# Patient Record
Sex: Female | Born: 1970 | Race: White | Hispanic: No | Marital: Married | Smoking: Never smoker
Health system: Southern US, Community
[De-identification: ages and names within clinical notes are randomized; demographics above are authoritative.]

## PROBLEM LIST (undated history)

## (undated) DIAGNOSIS — B019 Varicella without complication: Secondary | ICD-10-CM

## (undated) HISTORY — DX: Varicella without complication: B01.9

---

## 1998-02-06 ENCOUNTER — Other Ambulatory Visit: Admission: RE | Admit: 1998-02-06 | Discharge: 1998-02-06 | Payer: Self-pay | Admitting: *Deleted

## 1998-05-20 ENCOUNTER — Inpatient Hospital Stay (HOSPITAL_COMMUNITY): Admission: AD | Admit: 1998-05-20 | Discharge: 1998-05-20 | Payer: Self-pay | Admitting: Obstetrics and Gynecology

## 1998-06-16 ENCOUNTER — Encounter: Admission: RE | Admit: 1998-06-16 | Discharge: 1998-09-14 | Payer: Self-pay | Admitting: Obstetrics and Gynecology

## 1998-08-22 ENCOUNTER — Inpatient Hospital Stay (HOSPITAL_COMMUNITY): Admission: AD | Admit: 1998-08-22 | Discharge: 1998-08-23 | Payer: Self-pay | Admitting: Obstetrics and Gynecology

## 1998-09-23 ENCOUNTER — Other Ambulatory Visit: Admission: RE | Admit: 1998-09-23 | Discharge: 1998-09-23 | Payer: Self-pay | Admitting: Obstetrics and Gynecology

## 1999-09-21 ENCOUNTER — Other Ambulatory Visit: Admission: RE | Admit: 1999-09-21 | Discharge: 1999-09-21 | Payer: Self-pay | Admitting: Obstetrics and Gynecology

## 1999-11-16 ENCOUNTER — Other Ambulatory Visit: Admission: RE | Admit: 1999-11-16 | Discharge: 1999-11-16 | Payer: Self-pay | Admitting: Obstetrics and Gynecology

## 1999-11-16 ENCOUNTER — Encounter (INDEPENDENT_AMBULATORY_CARE_PROVIDER_SITE_OTHER): Payer: Self-pay | Admitting: Specialist

## 2000-04-28 ENCOUNTER — Other Ambulatory Visit: Admission: RE | Admit: 2000-04-28 | Discharge: 2000-04-28 | Payer: Self-pay | Admitting: *Deleted

## 2001-02-23 ENCOUNTER — Other Ambulatory Visit: Admission: RE | Admit: 2001-02-23 | Discharge: 2001-02-23 | Payer: Self-pay | Admitting: Obstetrics and Gynecology

## 2002-03-01 ENCOUNTER — Other Ambulatory Visit: Admission: RE | Admit: 2002-03-01 | Discharge: 2002-03-01 | Payer: Self-pay | Admitting: Obstetrics and Gynecology

## 2003-03-05 ENCOUNTER — Other Ambulatory Visit: Admission: RE | Admit: 2003-03-05 | Discharge: 2003-03-05 | Payer: Self-pay | Admitting: Obstetrics and Gynecology

## 2003-05-01 ENCOUNTER — Emergency Department (HOSPITAL_COMMUNITY): Admission: AD | Admit: 2003-05-01 | Discharge: 2003-05-01 | Payer: Self-pay | Admitting: Family Medicine

## 2004-09-16 ENCOUNTER — Encounter: Admission: RE | Admit: 2004-09-16 | Discharge: 2004-09-16 | Payer: Self-pay | Admitting: Obstetrics and Gynecology

## 2004-10-29 ENCOUNTER — Inpatient Hospital Stay (HOSPITAL_COMMUNITY): Admission: AD | Admit: 2004-10-29 | Discharge: 2004-10-29 | Payer: Self-pay | Admitting: Obstetrics and Gynecology

## 2004-10-30 LAB — CONVERTED CEMR LAB: Pap Smear: NORMAL

## 2004-10-31 ENCOUNTER — Observation Stay (HOSPITAL_COMMUNITY): Admission: AD | Admit: 2004-10-31 | Discharge: 2004-10-31 | Payer: Self-pay | Admitting: Obstetrics and Gynecology

## 2004-11-11 ENCOUNTER — Inpatient Hospital Stay (HOSPITAL_COMMUNITY): Admission: AD | Admit: 2004-11-11 | Discharge: 2004-11-13 | Payer: Self-pay | Admitting: Obstetrics and Gynecology

## 2007-10-19 ENCOUNTER — Ambulatory Visit: Payer: Self-pay | Admitting: Family Medicine

## 2007-10-19 DIAGNOSIS — Z8679 Personal history of other diseases of the circulatory system: Secondary | ICD-10-CM | POA: Insufficient documentation

## 2007-10-19 DIAGNOSIS — D509 Iron deficiency anemia, unspecified: Secondary | ICD-10-CM | POA: Insufficient documentation

## 2007-10-19 DIAGNOSIS — Z8742 Personal history of other diseases of the female genital tract: Secondary | ICD-10-CM

## 2007-10-19 DIAGNOSIS — J309 Allergic rhinitis, unspecified: Secondary | ICD-10-CM | POA: Insufficient documentation

## 2007-10-19 DIAGNOSIS — R87619 Unspecified abnormal cytological findings in specimens from cervix uteri: Secondary | ICD-10-CM

## 2007-10-19 DIAGNOSIS — R5383 Other fatigue: Secondary | ICD-10-CM

## 2007-10-19 DIAGNOSIS — N920 Excessive and frequent menstruation with regular cycle: Secondary | ICD-10-CM

## 2007-10-19 DIAGNOSIS — R5381 Other malaise: Secondary | ICD-10-CM

## 2007-12-26 ENCOUNTER — Ambulatory Visit: Payer: Self-pay | Admitting: Family Medicine

## 2007-12-27 ENCOUNTER — Encounter: Payer: Self-pay | Admitting: Family Medicine

## 2007-12-28 ENCOUNTER — Other Ambulatory Visit: Admission: RE | Admit: 2007-12-28 | Discharge: 2007-12-28 | Payer: Self-pay | Admitting: Family Medicine

## 2007-12-28 ENCOUNTER — Encounter: Payer: Self-pay | Admitting: Family Medicine

## 2007-12-28 ENCOUNTER — Ambulatory Visit: Payer: Self-pay | Admitting: Family Medicine

## 2007-12-28 LAB — CONVERTED CEMR LAB
ALT: 11 units/L (ref 0–35)
AST: 12 units/L (ref 0–37)
Basophils Relative: 0 % (ref 0–1)
Calcium: 9.1 mg/dL (ref 8.4–10.5)
Chloride: 108 meq/L (ref 96–112)
Creatinine, Ser: 0.88 mg/dL (ref 0.40–1.20)
Folate: 13.6 ng/mL
Hemoglobin: 13.6 g/dL (ref 12.0–15.0)
INR: 1 (ref 0.0–1.5)
Lymphocytes Relative: 33 % (ref 12–46)
MCHC: 33.3 g/dL (ref 30.0–36.0)
Monocytes Relative: 9 % (ref 3–12)
Neutro Abs: 3 10*3/uL (ref 1.7–7.7)
Neutrophils Relative %: 56 % (ref 43–77)
RBC: 4.52 M/uL (ref 3.87–5.11)
Sodium: 138 meq/L (ref 135–145)
TSH: 1.603 microintl units/mL (ref 0.350–4.50)
Total Bilirubin: 0.6 mg/dL (ref 0.3–1.2)
Total CHOL/HDL Ratio: 3
Total Protein: 6.8 g/dL (ref 6.0–8.3)
VLDL: 17 mg/dL (ref 0–40)
Vitamin B-12: 251 pg/mL (ref 211–911)
WBC: 5.3 10*3/uL (ref 4.0–10.5)
aPTT: 37 s (ref 24–37)

## 2008-01-01 ENCOUNTER — Encounter (INDEPENDENT_AMBULATORY_CARE_PROVIDER_SITE_OTHER): Payer: Self-pay | Admitting: *Deleted

## 2013-06-21 ENCOUNTER — Other Ambulatory Visit (HOSPITAL_COMMUNITY)
Admission: RE | Admit: 2013-06-21 | Discharge: 2013-06-21 | Disposition: A | Discharge status: Home / Self Care | Payer: BC Managed Care – PPO | Source: Ambulatory Visit | Attending: Internal Medicine | Admitting: Internal Medicine

## 2013-06-21 ENCOUNTER — Ambulatory Visit (INDEPENDENT_AMBULATORY_CARE_PROVIDER_SITE_OTHER): Payer: BC Managed Care – PPO | Admitting: Internal Medicine

## 2013-06-21 ENCOUNTER — Encounter: Payer: Self-pay | Admitting: Internal Medicine

## 2013-06-21 VITALS — BP 126/80 | HR 73 | Temp 98.3°F | Ht 67.75 in | Wt 226.5 lb

## 2013-06-21 DIAGNOSIS — Z1151 Encounter for screening for human papillomavirus (HPV): Secondary | ICD-10-CM | POA: Insufficient documentation

## 2013-06-21 DIAGNOSIS — Z0000 Encounter for general adult medical examination without abnormal findings: Secondary | ICD-10-CM

## 2013-06-21 DIAGNOSIS — Z800 Family history of malignant neoplasm of digestive organs: Secondary | ICD-10-CM

## 2013-06-21 DIAGNOSIS — R1011 Right upper quadrant pain: Secondary | ICD-10-CM

## 2013-06-21 DIAGNOSIS — Z01419 Encounter for gynecological examination (general) (routine) without abnormal findings: Secondary | ICD-10-CM | POA: Insufficient documentation

## 2013-06-21 DIAGNOSIS — E669 Obesity, unspecified: Secondary | ICD-10-CM | POA: Insufficient documentation

## 2013-06-21 LAB — CBC
HCT: 43.3 % (ref 36.0–46.0)
Hemoglobin: 14.2 g/dL (ref 12.0–15.0)
MCHC: 32.8 g/dL (ref 30.0–36.0)
MCV: 92.7 fl (ref 78.0–100.0)
PLATELETS: 265 10*3/uL (ref 150.0–400.0)
RBC: 4.67 Mil/uL (ref 3.87–5.11)
RDW: 13.5 % (ref 11.5–14.6)
WBC: 8.9 10*3/uL (ref 4.5–10.5)

## 2013-06-21 LAB — COMPREHENSIVE METABOLIC PANEL
ALT: 19 U/L (ref 0–35)
AST: 14 U/L (ref 0–37)
Albumin: 4 g/dL (ref 3.5–5.2)
Alkaline Phosphatase: 78 U/L (ref 39–117)
BILIRUBIN TOTAL: 0.4 mg/dL (ref 0.3–1.2)
BUN: 14 mg/dL (ref 6–23)
CO2: 26 mEq/L (ref 19–32)
CREATININE: 0.9 mg/dL (ref 0.4–1.2)
Calcium: 9.1 mg/dL (ref 8.4–10.5)
Chloride: 104 mEq/L (ref 96–112)
GFR: 71.73 mL/min (ref >60.00)
Glucose, Bld: 90 mg/dL (ref 70–99)
Potassium: 3.9 mEq/L (ref 3.5–5.1)
Sodium: 136 mEq/L (ref 135–145)
Total Protein: 7 g/dL (ref 6.0–8.3)

## 2013-06-21 LAB — LIPID PANEL
Cholesterol: 146 mg/dL (ref 0–200)
HDL: 42.7 mg/dL (ref >39.00)
LDL CALC: 77 mg/dL (ref 0–99)
TRIGLYCERIDES: 134 mg/dL (ref 0.0–149.0)
Total CHOL/HDL Ratio: 3
VLDL: 26.8 mg/dL (ref 0.0–40.0)

## 2013-06-21 LAB — HEMOGLOBIN A1C: Hgb A1c MFr Bld: 5.8 % (ref 4.6–6.5)

## 2013-06-21 LAB — TSH: TSH: 1.07 u[IU]/mL (ref 0.35–5.50)

## 2013-06-21 NOTE — Patient Instructions (Addendum)

## 2013-06-21 NOTE — Assessment & Plan Note (Signed)
Advised low carb diet Will consider joining weight watchers

## 2013-06-21 NOTE — Progress Notes (Signed)
HPI  Pt presents to the clinic today to establish care. She has not had a PCP in > 5 years. She does have a few concerns today.  1-RUQ abdominal pain. This started about 2 years ago. The pain seems to come and go. She denies associated s/s such as nausea, vomiting, diarrhea or constipation. She is having normal BMS. She denies blood in her stool. She does have a long standing family history of colon cancer.  2- Left breast pain. She noticed this about 1 month ago. She describes the pain as sharp and shooting. She has noticed a lump in her breast during a SBE. She denies discharge from the nipple. She denies any trauma to the breast.  Flu: never Tetanus: 2009 LMP: 06/04/13 Pap Smear: more than 5 years ago Mammogram: never Eye Doctor: as needed Dentist: biannually  Past Medical History  Diagnosis Date  . Chicken pox     Current Outpatient Prescriptions  Medication Sig Dispense Refill  . Ascorbic Acid (CHEWABLE VITAMIN C PO) Take 2 each by mouth daily.      . Cholecalciferol (VITAMIN D3) 1000 UNITS CAPS Take 2 capsules by mouth daily.       No current facility-administered medications for this visit.    No Known Allergies  Family History  Problem Relation Age of Onset  . Colon cancer Mother   . Hypothyroidism Father   . Colon cancer Maternal Grandmother   . Prostate cancer Maternal Grandfather   . Hyperlipidemia Paternal Grandmother   . Heart disease Paternal Grandmother   . Stroke Paternal Grandmother   . Hypertension Paternal Grandmother     History   Social History  . Marital Status: Married    Spouse Name: N/A    Number of Children: N/A  . Years of Education: N/A   Occupational History  . Not on file.   Social History Main Topics  . Smoking status: Never Smoker   . Smokeless tobacco: Never Used  . Alcohol Use: No  . Drug Use: No  . Sexual Activity: Yes   Other Topics Concern  . Not on file   Social History Narrative  . No narrative on file     ROS:  Constitutional: Denies fever, malaise, fatigue, headache or abrupt weight changes.  HEENT: Denies eye pain, eye redness, ear pain, ringing in the ears, wax buildup, runny nose, nasal congestion, bloody nose, or sore throat. Respiratory: Denies difficulty breathing, shortness of breath, cough or sputum production.   Cardiovascular: Denies chest pain, chest tightness, palpitations or swelling in the hands or feet.  Gastrointestinal: Pt reports RUQ pain. Denies bloating, constipation, diarrhea or blood in the stool.  GU: Denies frequency, urgency, pain with urination, blood in urine, odor or discharge. Musculoskeletal: Pt reports left breast pain. Denies decrease in range of motion, difficulty with gait, muscle pain or joint pain and swelling.  Skin: Denies redness, rashes, lesions or ulcercations.  Neurological: Denies dizziness, difficulty with memory, difficulty with speech or problems with balance and coordination.   No other specific complaints in a complete review of systems (except as listed in HPI above).  PE:  BP 126/80  Pulse 73  Temp(Src) 98.3 F (36.8 C) (Oral)  Ht 5' 7.75" (1.721 m)  Wt 226 lb 8 oz (102.74 kg)  BMI 34.69 kg/m2  SpO2 99%  LMP 06/04/2013 Wt Readings from Last 3 Encounters:  06/21/13 226 lb 8 oz (102.74 kg)  12/28/07 213 lb 2.1 oz (96.676 kg)  10/19/07 214 lb 4 oz (  97.183 kg)    General: Appears her stated age, obese but well developed, well nourished in NAD. HEENT: Head: normal shape and size; Eyes: sclera white, no icterus, conjunctiva pink, PERRLA and EOMs intact; Ears: Tm's gray and intact, normal light reflex; Nose: mucosa pink and moist, septum midline; Throat/Mouth: Teeth present, mucosa pink and moist, no lesions or ulcerations noted.  Neck: Normal range of motion. Neck supple, trachea midline. No massses, lumps or thyromegaly present.  Cardiovascular: Normal rate and rhythm. S1,S2 noted.  No murmur, rubs or gallops noted. No JVD or BLE  edema. No carotid bruits noted. Pulmonary/Chest: Normal effort and positive vesicular breath sounds. No respiratory distress. No wheezes, rales or ronchi noted. Breast exam normal- some fibrocystic changes noted. Abdomen: Soft and mildly tender with palpation of the right kidney. Normal bowel sounds, no bruits noted. No distention or masses noted. Liver, spleen and kidneys non palpable. Musculoskeletal: Normal range of motion. No signs of joint swelling. No difficulty with gait. DRE normal, hemoccult negative. Neurological: Alert and oriented. Cranial nerves II-XII intact. Coordination normal. +DTRs bilaterally. Psychiatric: Mood and affect normal. Behavior is normal. Judgment and thought content normal.     BMET    Component Value Date/Time   NA 138 12/27/2007 0006   K 4.3 12/27/2007 0006   CL 108 12/27/2007 0006   CO2 23 12/27/2007 0006   GLUCOSE 101* 12/27/2007 0006   BUN 13 12/27/2007 0006   CREATININE 0.88 12/27/2007 0006   CALCIUM 9.1 12/27/2007 0006    Lipid Panel     Component Value Date/Time   CHOL 129 12/27/2007 0006   TRIG 85 12/27/2007 0006   HDL 43 12/27/2007 0006   CHOLHDL 3.0 Ratio 12/27/2007 0006   VLDL 17 12/27/2007 0006   LDLCALC 69 12/27/2007 0006    CBC    Component Value Date/Time   WBC 5.3 12/27/2007 0006   RBC 4.52 12/27/2007 0006   HGB 13.6 12/27/2007 0006   HCT 40.9 12/27/2007 0006   PLT 272 12/27/2007 0006   MCV 90.5 12/27/2007 0006   MCHC 33.3 12/27/2007 0006   RDW 13.2 12/27/2007 0006   LYMPHSABS 1.7 12/27/2007 0006   MONOABS 0.5 12/27/2007 0006   EOSABS 0.1 12/27/2007 0006   BASOSABS 0.0 12/27/2007 0006    Hgb A1C No results found for this basename: HGBA1C     Assessment and Plan:  Preventative Health Maintenance:   Pap smear obtained today Breast exam today Will refer for screening mammogram Encouraged her to work on diet and exercise (she will think about joining weight watchers) Encouraged her to visit an eye doctor at least annually Labs obtained  today  Abdominal pain:  Will check CMET to assess for gallbladder/liver disease Will have her do IFOB to check for blood at home- she is very concerned about colon cancer RTC in 1 year or sooner if needed

## 2013-06-21 NOTE — Progress Notes (Signed)
Pre visit review using our clinic review tool, if applicable. No additional management support is needed unless otherwise documented below in the visit note. 

## 2013-06-26 ENCOUNTER — Other Ambulatory Visit (INDEPENDENT_AMBULATORY_CARE_PROVIDER_SITE_OTHER): Payer: BC Managed Care – PPO

## 2013-06-26 DIAGNOSIS — R1011 Right upper quadrant pain: Secondary | ICD-10-CM

## 2013-06-26 DIAGNOSIS — Z800 Family history of malignant neoplasm of digestive organs: Secondary | ICD-10-CM

## 2013-06-26 LAB — FECAL OCCULT BLOOD, IMMUNOCHEMICAL: Fecal Occult Bld: NEGATIVE

## 2013-07-11 ENCOUNTER — Other Ambulatory Visit: Payer: Self-pay | Admitting: Internal Medicine

## 2013-07-11 ENCOUNTER — Ambulatory Visit
Admission: RE | Admit: 2013-07-11 | Discharge: 2013-07-11 | Disposition: A | Discharge status: Home / Self Care | Payer: BC Managed Care – PPO | Source: Ambulatory Visit | Attending: Internal Medicine | Admitting: Internal Medicine

## 2013-07-11 ENCOUNTER — Ambulatory Visit: Payer: BC Managed Care – PPO

## 2013-07-11 DIAGNOSIS — Z0000 Encounter for general adult medical examination without abnormal findings: Secondary | ICD-10-CM

## 2013-07-11 DIAGNOSIS — N644 Mastodynia: Secondary | ICD-10-CM

## 2013-07-24 ENCOUNTER — Ambulatory Visit
Admission: RE | Admit: 2013-07-24 | Discharge: 2013-07-24 | Disposition: A | Discharge status: Home / Self Care | Payer: BC Managed Care – PPO | Source: Ambulatory Visit | Attending: Internal Medicine | Admitting: Internal Medicine

## 2013-07-24 DIAGNOSIS — N644 Mastodynia: Secondary | ICD-10-CM

## 2014-11-21 ENCOUNTER — Encounter: Payer: Self-pay | Admitting: Internal Medicine

## 2014-11-21 ENCOUNTER — Ambulatory Visit (INDEPENDENT_AMBULATORY_CARE_PROVIDER_SITE_OTHER): Payer: BLUE CROSS/BLUE SHIELD | Admitting: Internal Medicine

## 2014-11-21 VITALS — BP 126/70 | HR 77 | Temp 98.1°F | Wt 220.0 lb

## 2014-11-21 DIAGNOSIS — W5503XA Scratched by cat, initial encounter: Secondary | ICD-10-CM

## 2014-11-21 DIAGNOSIS — S80812A Abrasion, left lower leg, initial encounter: Secondary | ICD-10-CM

## 2014-11-21 MED ORDER — AMOXICILLIN-POT CLAVULANATE 875-125 MG PO TABS: 1.0000 | ORAL_TABLET | Freq: Two times a day (BID) | ORAL | Status: DC

## 2014-11-21 NOTE — Progress Notes (Signed)
Pre visit review using our clinic review tool, if applicable. No additional management support is needed unless otherwise documented below in the visit note. 

## 2014-11-21 NOTE — Progress Notes (Signed)
Subjective:    Patient ID: Alyssa Delgado, female    DOB: 1970/10/06, 44 y.o.   MRN: [id number]  HPI  Pt presents to the clinic today with c/o a cat scratch to the back of her left calf. This occurred 3 days ago. It was her cat and it is UTD on all shots. She has noticed blisters surrounding the cat scracth. She has also noticed a little warmth and redness but has not noticed any drainage in the area. She thinks she has been running a fever but is not sure. She has not noticed any swollen glands. She has washed it with soap and water. She has rubbed baking soda and cortisone cream on it without relief.   Review of Systems      Past Medical History  Diagnosis Date  . Chicken pox     Current Outpatient Prescriptions  Medication Sig Dispense Refill  . Ascorbic Acid (CHEWABLE VITAMIN C PO) Take 2 each by mouth daily.    . Cholecalciferol (VITAMIN D3) 1000 UNITS CAPS Take 2 capsules by mouth daily.     No current facility-administered medications for this visit.    No Known Allergies  Family History  Problem Relation Age of Onset  . Colon cancer Mother   . Hypothyroidism Father   . Colon cancer Maternal Grandmother   . Prostate cancer Maternal Grandfather   . Hyperlipidemia Paternal Grandmother   . Heart disease Paternal Grandmother   . Stroke Paternal Grandmother   . Hypertension Paternal Grandmother     History   Social History  . Marital Status: Married    Spouse Name: N/A  . Number of Children: N/A  . Years of Education: N/A   Occupational History  . Not on file.   Social History Main Topics  . Smoking status: Never Smoker   . Smokeless tobacco: Never Used  . Alcohol Use: No  . Drug Use: No  . Sexual Activity: Yes   Other Topics Concern  . Not on file   Social History Narrative     Constitutional: Denies fever, malaise, fatigue, headache or abrupt weight changes.  Respiratory: Denies difficulty breathing, shortness of breath, cough or sputum  production.   Cardiovascular: Denies chest pain, chest tightness, palpitations or swelling in the hands or feet.   Skin: Pt reports cat scratch. Denies lesions or ulcercations.   No other specific complaints in a complete review of systems (except as listed in HPI above).  Objective:   Physical Exam   BP 126/70 mmHg  Pulse 77  Temp(Src) 98.1 F (36.7 C) (Oral)  Wt 220 lb (99.791 kg)  SpO2 97%  LMP 11/07/2014 Wt Readings from Last 3 Encounters:  11/21/14 220 lb (99.791 kg)  06/21/13 226 lb 8 oz (102.74 kg)  12/28/07 213 lb 2.1 oz (96.676 kg)    General: Appears her stated age, well developed, well nourished in NAD. Skin: Linear cat scratch noted to back of right calf. Blistery rash in annular formation noted around cat scratch. Area is very red, not really warm or tender to touch. Nodes: No lymphadenopathy noted.  Cardiovascular: Normal rate and rhythm. S1,S2 noted.  No murmur, rubs or gallops noted.  Pulmonary/Chest: Normal effort and positive vesicular breath sounds. No respiratory distress. No wheezes, rales or ronchi noted.  Neurological: Alert and oriented.   BMET    Component Value Date/Time   NA 136 06/21/2013 1422   K 3.9 06/21/2013 1422   CL 104 06/21/2013 1422   CO2  26 06/21/2013 1422   GLUCOSE 90 06/21/2013 1422   BUN 14 06/21/2013 1422   CREATININE 0.9 06/21/2013 1422   CALCIUM 9.1 06/21/2013 1422    Lipid Panel     Component Value Date/Time   CHOL 146 06/21/2013 1422   TRIG 134.0 06/21/2013 1422   HDL 42.70 06/21/2013 1422   CHOLHDL 3 06/21/2013 1422   VLDL 26.8 06/21/2013 1422   LDLCALC 77 06/21/2013 1422    CBC    Component Value Date/Time   WBC 8.9 06/21/2013 1422   RBC 4.67 06/21/2013 1422   HGB 14.2 06/21/2013 1422   HCT 43.3 06/21/2013 1422   PLT 265.0 06/21/2013 1422   MCV 92.7 06/21/2013 1422   MCHC 32.8 06/21/2013 1422   RDW 13.5 06/21/2013 1422   LYMPHSABS 1.7 12/27/2007 0006   MONOABS 0.5 12/27/2007 0006   EOSABS 0.1 12/27/2007  0006   BASOSABS 0.0 12/27/2007 0006    Hgb A1C Lab Results  Component Value Date   HGBA1C 5.8 06/21/2013        Assessment & Plan:   Cat Scratch:  Rash is very concerning for cat scratch fever eRx for Augmentin BID x 10 days Continue to wash with soap and water Watch for fever, enlarged lymph nodes  RTC as needed or if symptoms persist or worsen

## 2014-11-21 NOTE — Patient Instructions (Signed)
Cat Scratch Disease Cats often injure people by scratching or biting. This site of injury can become infected with a particular germ or bacteria present in the mouth of or on the cat. This germ is called Bartonella henselae. This infection is identified by the common name cat scratch disease (CSD).  SYMPTOMS  A red and sore pimple or bump, with or without pus, on the skin where the cat scratched or bit. The pimple or sore may be present for as long as three weeks after the scratch or bite occurred.  One or more enlarged lymph glands located toward the center of the body from where the injury occurred.  Less common symptoms include low-grade fever, tiredness, fatigue, headache and/or sore throat. DIAGNOSIS  The diagnosis is typically made by your caregiver who notes the history of a scratch or bite from a cat, and finds the skin sore and swollen lymph glands in the described area.  Culture of any drainage or pus from the injury site, or a needle aspiration or piece of tissue (biopsy) from a swollen lymph gland may also be done to confirm the diagnosis and assure that a different infection or disease is not causing your illness. Rare but serious complications may occur, they include:  Parinaud's syndrome - fever, swollen lymph glands and inflammation of the eye (conjunctivitis).  Infection of the brain (encephalitis).  Infection of the nerve of the eye (neuroretinitis).  Infection of the bone (osteomyelitis). TREATMENT  Usually treatment is not necessary or helpful, especially if you have a normal immune system. When infection is very severe, it may be treated with a medicine that kills the bacteria (antibiotic).  People with immune system problems (such as having AIDS or an organ transplant, or being on steroids or other immune modifying drugs) should be treated with antibiotics. HOME CARE INSTRUCTIONS   Avoid injury while playing with cats.  Wash well after playing with cats.  Do  not let your cat lick sores on your body.  Do not let your cat roam around outside of your house.  Keep the area of the cat scratch clean. Wash it with soap and water or apply an antiseptic solution such as povidone iodine.  You should get a tetanus shot if you have not had one in the past 5 or 10 years. If you receive one, your arm may get swollen and red and warm to the touch at the shot site. This is a common response to the medication in the shot. If you did not receive a tetanus shot here because you did not recall when your last one was given, make sure to check with your caregiver's office and determine if one is needed. Generally, for a "dirty" wound, you should receive a tetanus booster if you have not had one in the last five years. If you have a "clean" wound, you should receive a tetanus booster if you have not had one in the last ten years. SEEK IMMEDIATE MEDICAL CARE IF:   You have worsening signs of infection, such as more redness, increased pain, red streaking or pus coming from the wound, or warmth or swelling around the area of the scratch.  You develop worsening swollen lymph glands.  You develop abdominal pain, have problems with your vision or develop a skin rash.  You have a fever.  You become more tired or dizzy, or have a worsening headache.  You develop inflammation of your eye or have increasing vision problems.  You have pain in   one of your bones.  You develop a stiff neck.  You pass out. MAKE SURE YOU:   Understand these instructions.  Will watch your condition.  Will get help right away if you are not doing well or get worse. Document Released: 04/15/2000 Document Revised: 07/11/2011 Document Reviewed: 05/28/2008 Exit[employer] Patient Information 2015 [employer]. This information is not intended to replace advice given to you by your health care provider. Make sure you discuss any questions you have with your health care provider.  

## 2017-01-31 ENCOUNTER — Ambulatory Visit (INDEPENDENT_AMBULATORY_CARE_PROVIDER_SITE_OTHER): Payer: BLUE CROSS/BLUE SHIELD | Admitting: Internal Medicine

## 2017-01-31 ENCOUNTER — Encounter: Payer: Self-pay | Admitting: Internal Medicine

## 2017-01-31 VITALS — BP 124/84 | HR 72 | Temp 98.1°F | Wt 222.0 lb

## 2017-01-31 DIAGNOSIS — L659 Nonscarring hair loss, unspecified: Secondary | ICD-10-CM

## 2017-01-31 DIAGNOSIS — N926 Irregular menstruation, unspecified: Secondary | ICD-10-CM

## 2017-01-31 DIAGNOSIS — R350 Frequency of micturition: Secondary | ICD-10-CM | POA: Diagnosis not present

## 2017-01-31 DIAGNOSIS — R109 Unspecified abdominal pain: Secondary | ICD-10-CM | POA: Diagnosis not present

## 2017-01-31 LAB — POCT URINE PREGNANCY: Preg Test, Ur: NEGATIVE

## 2017-01-31 LAB — POCT URINALYSIS DIPSTICK
BILIRUBIN UA: NEGATIVE
Glucose, UA: NEGATIVE
Ketones, UA: NEGATIVE
NITRITE UA: NEGATIVE
Protein, UA: NEGATIVE
Spec Grav, UA: 1.025 (ref 1.010–1.025)
UROBILINOGEN UA: 0.2 U/dL
pH, UA: 6 (ref 5.0–8.0)

## 2017-02-01 ENCOUNTER — Encounter: Payer: Self-pay | Admitting: Internal Medicine

## 2017-02-01 LAB — COMPREHENSIVE METABOLIC PANEL
ALT: 17 U/L (ref 0–35)
AST: 13 U/L (ref 0–37)
Albumin: 4.1 g/dL (ref 3.5–5.2)
Alkaline Phosphatase: 82 U/L (ref 39–117)
BUN: 8 mg/dL (ref 6–23)
CO2: 28 mEq/L (ref 19–32)
CREATININE: 0.77 mg/dL (ref 0.40–1.20)
Calcium: 9.2 mg/dL (ref 8.4–10.5)
Chloride: 106 mEq/L (ref 96–112)
GFR: 86.56 mL/min (ref >60.00)
Glucose, Bld: 89 mg/dL (ref 70–99)
Potassium: 4.7 mEq/L (ref 3.5–5.1)
Sodium: 141 mEq/L (ref 135–145)
Total Bilirubin: 0.4 mg/dL (ref 0.2–1.2)
Total Protein: 6.6 g/dL (ref 6.0–8.3)

## 2017-02-01 LAB — CBC
HCT: 39.1 % (ref 36.0–46.0)
HEMOGLOBIN: 12.8 g/dL (ref 12.0–15.0)
MCHC: 32.8 g/dL (ref 30.0–36.0)
MCV: 92.3 fl (ref 78.0–100.0)
Platelets: 280 10*3/uL (ref 150.0–400.0)
RBC: 4.24 Mil/uL (ref 3.87–5.11)
RDW: 13.9 % (ref 11.5–15.5)
WBC: 6.8 10*3/uL (ref 4.0–10.5)

## 2017-02-01 LAB — FOLLICLE STIMULATING HORMONE: FSH: 5.4 m[IU]/mL

## 2017-02-01 LAB — URINE CULTURE
MICRO NUMBER:: 81092305
Result:: NO GROWTH
SPECIMEN QUALITY:: ADEQUATE

## 2017-02-01 LAB — LUTEINIZING HORMONE: LH: 5.88 m[IU]/mL

## 2017-02-01 LAB — VITAMIN B12: VITAMIN B 12: 131 pg/mL — AB (ref 211–911)

## 2017-02-01 LAB — TSH: TSH: 1.84 u[IU]/mL (ref 0.35–4.50)

## 2017-02-01 LAB — VITAMIN D 25 HYDROXY (VIT D DEFICIENCY, FRACTURES): VITD: 12.92 ng/mL — ABNORMAL LOW (ref 30.00–100.00)

## 2017-02-01 NOTE — Patient Instructions (Signed)
Alopecia Areata, Adult  Alopecia areata is a condition that causes you to lose hair. You may lose hair on your scalp in patches. In some cases, you may lose all the hair on your scalp (alopecia totalis) or all the hair from your face and body (alopecia universalis).  Alopecia areata is an autoimmune disease. This means that your body's defense system (immune system) mistakes normal parts of the body for germs or other things that can make you sick. When you have alopecia areata, the immune system attacks the hair follicles.  Alopecia areata usually develops in childhood, but it can develop at any age. For some people, their hair grows back on its own and hair loss does not happen again. For others, their hair may fall out and grow back in cycles. The hair loss may last many years. Having this condition can be emotionally difficult, but it is not dangerous.  What are the causes?  The cause of this condition is not known.  What increases the risk?  This condition is more likely to develop in people who have:   A family history of alopecia.   A family history of another autoimmune disease, including type 1 diabetes and rheumatoid arthritis.   Asthma and allergies.   Down syndrome.    What are the signs or symptoms?  Round spots of patchy hair loss on the scalp is the main symptom of this condition. The spots may be mildly itchy. Other symptoms include:   Short dark hairs in the bald patches that are wider at the top (exclamation point hairs).   Dents, white spots, or lines in the fingernails or toenails.   Balding and body hair loss. This is rare.    How is this diagnosed?  This condition is diagnosed based on your symptoms and family history. Your health care provider will also check your scalp skin, teeth, and nails. Your health care provider may refer you to a specialist in hair and skin disorders (dermatologist). You may also have tests, including:   A hair pull test.   Blood tests or other screening tests  to check for autoimmune diseases, such as thyroid disease or diabetes.   Skin biopsy to confirm the diagnosis.   A procedure to examine the skin with a lighted magnifying instrument (dermoscopy).    How is this treated?  There is no cure for alopecia areata. Treatment is aimed at promoting the regrowth of hair and preventing the immune system from overreacting. No single treatment is right for all people with alopecia areata. It depends on the type of hair loss you have and how severe it is. Work with your health care provider to find the best treatment for you. Treatment may include:   Having regular checkups to make sure the condition is not getting worse (watchful waiting).   Steroid creams or pills for 6-8 weeks to stop the immune reaction and help hair to regrow more quickly.   Other topical medicines to alter the immune system response and support the hair growth cycle.   Steroid injections.   Therapy and counseling with a support group or therapist if you are having trouble coping with hair loss.    Follow these instructions at home:   Learn as much as you can about your condition.   Apply topical creams only as told by your health care provider.   Take over-the-counter and prescription medicines only as told by your health care provider.   Consider getting a wig or   products to make hair look fuller or to cover bald spots, if you feel uncomfortable with your appearance.   Get therapy or counseling if you are having a hard time coping with hair loss. Ask your health care provider to recommend a counselor or support group.   Keep all follow-up visits as told by your health care provider. This is important.  Contact a health care provider if:   Your hair loss gets worse, even with treatment.   You have new symptoms.   You are struggling emotionally.  Summary   Alopecia areata is an autoimmune condition that makes your body's defense system (immune system) attack the hair follicles. This causes  you to lose hair.   Treatments may include regular checkups to make sure that the condition is not getting worse (watchful waiting), medicines, and steroid injections.  This information is not intended to replace advice given to you by your health care provider. Make sure you discuss any questions you have with your health care provider.  Document Released: 11/21/2003 Document Revised: 05/06/2016 Document Reviewed: 05/06/2016  Elsevier Interactive Patient Education  2018 [employer]

## 2017-02-01 NOTE — Progress Notes (Signed)
Subjective:    Patient ID: Alyssa Delgado, female    DOB: [birth year], 46 y.o.   MRN: [id number]  HPI  Pt presents to the clinic today with c/o irregular periods. This has been going on for 3 months. She reports this last menses, started 11 days ago and she is still bleeding. She reports associated lower abdominal pressure and urinary frequency, but denies urgency or dysuria. She is sexually active but denies vaginal discharge or odor. She does not think she is pregnant. She has not taken anything OTC for her symptoms. Her last pap was in 2015.  She has also noticed some hair loss. She reports when she gets out of the shower, her hairs is coming out in clumps. She denies having any bald spots.  Review of Systems      Past Medical History:  Diagnosis Date  . Chicken pox     Current Outpatient Prescriptions  Medication Sig Dispense Refill  . Cholecalciferol (VITAMIN D3) 1000 UNITS CAPS Take 2 capsules by mouth daily.     No current facility-administered medications for this visit.     No Known Allergies  Family History  Problem Relation Age of Onset  . Colon cancer Mother   . Hypothyroidism Father   . Colon cancer Maternal Grandmother   . Prostate cancer Maternal Grandfather   . Hyperlipidemia Paternal Grandmother   . Heart disease Paternal Grandmother   . Stroke Paternal Grandmother   . Hypertension Paternal Grandmother     Social History   Social History  . Marital status: Married    Spouse name: N/A  . Number of children: N/A  . Years of education: N/A   Occupational History  . Not on file.   Social History Main Topics  . Smoking status: Never Smoker  . Smokeless tobacco: Never Used  . Alcohol use No  . Drug use: No  . Sexual activity: Yes   Other Topics Concern  . Not on file   Social History Narrative  . No narrative on file     Constitutional: Denies fever, malaise, fatigue, headache or abrupt weight changes.  Gastrointestinal: Pt reports  abdominal pressure. Denies abdominal pain, bloating, constipation, diarrhea or blood in the stool.  GU: Pt reports urinary frequency, and irregular periods. Denies urgency, pain with urination, burning sensation, blood in urine, odor or discharge.  No other specific complaints in a complete review of systems (except as listed in HPI above).  Objective:   Physical Exam   BP 124/84   Pulse 72   Temp 98.1 F (36.7 C) (Oral)   Wt 222 lb (100.7 kg)   LMP 01/20/2017   SpO2 98%   BMI 34.00 kg/m  Wt Readings from Last 3 Encounters:  01/31/17 222 lb (100.7 kg)  11/21/14 220 lb (99.8 kg)  06/21/13 226 lb 8 oz (102.7 kg)    General: Appears her stated age, in NAD. Abdomen: Soft and mildly tender in the left and right lower pelvic region. Normal bowel sounds. No distention or masses noted. No CVA tenderness. Pelvic: deferred due to bleeding.  BMET    Component Value Date/Time   NA 136 06/21/2013 1422   K 3.9 06/21/2013 1422   CL 104 06/21/2013 1422   CO2 26 06/21/2013 1422   GLUCOSE 90 06/21/2013 1422   BUN 14 06/21/2013 1422   CREATININE 0.9 06/21/2013 1422   CALCIUM 9.1 06/21/2013 1422    Lipid Panel     Component Value Date/Time   CHOL  146 06/21/2013 1422   TRIG 134.0 06/21/2013 1422   HDL 42.70 06/21/2013 1422   CHOLHDL 3 06/21/2013 1422   VLDL 26.8 06/21/2013 1422   LDLCALC 77 06/21/2013 1422    CBC    Component Value Date/Time   WBC 8.9 06/21/2013 1422   RBC 4.67 06/21/2013 1422   HGB 14.2 06/21/2013 1422   HCT 43.3 06/21/2013 1422   PLT 265.0 06/21/2013 1422   MCV 92.7 06/21/2013 1422   MCHC 32.8 06/21/2013 1422   RDW 13.5 06/21/2013 1422   LYMPHSABS 1.7 12/27/2007 0006   MONOABS 0.5 12/27/2007 0006   EOSABS 0.1 12/27/2007 0006   BASOSABS 0.0 12/27/2007 0006    Hgb A1C Lab Results  Component Value Date   HGBA1C 5.8 06/21/2013           Assessment & Plan:   Lower Abdominal Pressure, Urinary Frequency, Metrroghia:  Urinalysis: 2+ leuks, 3+  blood Will send urine culture Urine Hcg: negative Will check CBC, CMET, FSH, LH, TSH May need pelvic/transvaginal ultrasound She will schedule a pap when the bleeding has stopped May need Provera to help stop the bleeding but will await labs  Hair Loss:  Will check CBC, TSH, B12 and Vit D today  Will follow up after labs, return precautions discussed Nicki Reaper, NP

## 2017-02-02 ENCOUNTER — Other Ambulatory Visit: Payer: Self-pay | Admitting: Internal Medicine

## 2017-02-02 DIAGNOSIS — N926 Irregular menstruation, unspecified: Secondary | ICD-10-CM

## 2017-02-02 MED ORDER — VITAMIN D (ERGOCALCIFEROL) 1.25 MG (50000 UNIT) PO CAPS: 50000.0000 [IU] | ORAL_CAPSULE | ORAL | 0 refills | Status: DC

## 2017-02-03 ENCOUNTER — Encounter: Payer: Self-pay | Admitting: Internal Medicine

## 2017-02-03 ENCOUNTER — Ambulatory Visit (INDEPENDENT_AMBULATORY_CARE_PROVIDER_SITE_OTHER): Payer: BLUE CROSS/BLUE SHIELD

## 2017-02-03 DIAGNOSIS — E538 Deficiency of other specified B group vitamins: Secondary | ICD-10-CM | POA: Diagnosis not present

## 2017-02-03 MED ORDER — CYANOCOBALAMIN 1000 MCG/ML IJ SOLN
1000.0000 ug | Freq: Once | INTRAMUSCULAR | Status: AC
  Administered 2017-02-03: 1000 ug via INTRAMUSCULAR

## 2017-02-07 ENCOUNTER — Ambulatory Visit: Payer: BLUE CROSS/BLUE SHIELD

## 2017-02-10 ENCOUNTER — Ambulatory Visit
Admission: RE | Admit: 2017-02-10 | Discharge: 2017-02-10 | Disposition: A | Discharge status: Home / Self Care | Payer: BLUE CROSS/BLUE SHIELD | Source: Ambulatory Visit | Attending: Internal Medicine | Admitting: Internal Medicine

## 2017-02-10 DIAGNOSIS — N926 Irregular menstruation, unspecified: Secondary | ICD-10-CM | POA: Insufficient documentation

## 2017-02-13 ENCOUNTER — Encounter: Payer: Self-pay | Admitting: Internal Medicine

## 2017-02-28 ENCOUNTER — Encounter: Payer: Self-pay | Admitting: Internal Medicine

## 2017-03-03 ENCOUNTER — Ambulatory Visit (INDEPENDENT_AMBULATORY_CARE_PROVIDER_SITE_OTHER): Payer: BLUE CROSS/BLUE SHIELD

## 2017-03-03 DIAGNOSIS — E538 Deficiency of other specified B group vitamins: Secondary | ICD-10-CM | POA: Diagnosis not present

## 2017-03-03 MED ORDER — CYANOCOBALAMIN 1000 MCG/ML IJ SOLN
1000.0000 ug | Freq: Once | INTRAMUSCULAR | Status: AC
  Administered 2017-03-03: 1000 ug via INTRAMUSCULAR

## 2017-03-07 NOTE — Progress Notes (Signed)
I reviewed need for this B12 injection and approved

## 2017-04-07 ENCOUNTER — Ambulatory Visit (INDEPENDENT_AMBULATORY_CARE_PROVIDER_SITE_OTHER): Payer: BLUE CROSS/BLUE SHIELD

## 2017-04-07 ENCOUNTER — Encounter: Payer: Self-pay | Admitting: Internal Medicine

## 2017-04-07 DIAGNOSIS — E538 Deficiency of other specified B group vitamins: Secondary | ICD-10-CM

## 2017-04-07 MED ORDER — CYANOCOBALAMIN 1000 MCG/ML IJ SOLN
1000.0000 ug | Freq: Once | INTRAMUSCULAR | Status: AC
  Administered 2017-04-07: 1000 ug via INTRAMUSCULAR

## 2017-05-12 ENCOUNTER — Ambulatory Visit (INDEPENDENT_AMBULATORY_CARE_PROVIDER_SITE_OTHER): Payer: Managed Care, Other (non HMO)

## 2017-05-12 ENCOUNTER — Encounter: Payer: Self-pay | Admitting: Internal Medicine

## 2017-05-12 DIAGNOSIS — E538 Deficiency of other specified B group vitamins: Secondary | ICD-10-CM | POA: Diagnosis not present

## 2017-05-12 MED ORDER — CYANOCOBALAMIN 1000 MCG/ML IJ SOLN
1000.0000 ug | Freq: Once | INTRAMUSCULAR | Status: AC
  Administered 2017-05-12: 1000 ug via INTRAMUSCULAR

## 2018-03-23 ENCOUNTER — Emergency Department (HOSPITAL_COMMUNITY): Payer: Managed Care, Other (non HMO)

## 2018-03-23 ENCOUNTER — Encounter (HOSPITAL_COMMUNITY): Payer: Self-pay

## 2018-03-23 ENCOUNTER — Telehealth: Payer: Self-pay

## 2018-03-23 ENCOUNTER — Emergency Department (HOSPITAL_COMMUNITY)
Admission: EM | Admit: 2018-03-23 | Discharge: 2018-03-23 | Disposition: A | Discharge status: Home / Self Care | Payer: Managed Care, Other (non HMO) | Attending: Emergency Medicine | Admitting: Emergency Medicine

## 2018-03-23 DIAGNOSIS — Z79899 Other long term (current) drug therapy: Secondary | ICD-10-CM | POA: Diagnosis not present

## 2018-03-23 DIAGNOSIS — R112 Nausea with vomiting, unspecified: Secondary | ICD-10-CM

## 2018-03-23 DIAGNOSIS — R109 Unspecified abdominal pain: Secondary | ICD-10-CM | POA: Insufficient documentation

## 2018-03-23 LAB — COMPREHENSIVE METABOLIC PANEL
ALK PHOS: 76 U/L (ref 38–126)
ALT: 14 U/L (ref 0–44)
AST: 14 U/L — ABNORMAL LOW (ref 15–41)
Albumin: 3.8 g/dL (ref 3.5–5.0)
Anion gap: 9 (ref 5–15)
BUN: 9 mg/dL (ref 6–20)
CALCIUM: 8.8 mg/dL — AB (ref 8.9–10.3)
CO2: 21 mmol/L — ABNORMAL LOW (ref 22–32)
Chloride: 112 mmol/L — ABNORMAL HIGH (ref 98–111)
Creatinine, Ser: 0.85 mg/dL (ref 0.44–1.00)
GFR calc Af Amer: >60 mL/min (ref >60)
Glucose, Bld: 93 mg/dL (ref 70–99)
Potassium: 3.5 mmol/L (ref 3.5–5.1)
Sodium: 142 mmol/L (ref 135–145)
Total Bilirubin: 0.7 mg/dL (ref 0.3–1.2)
Total Protein: 6.6 g/dL (ref 6.5–8.1)

## 2018-03-23 LAB — URINALYSIS, ROUTINE W REFLEX MICROSCOPIC

## 2018-03-23 LAB — CBC WITH DIFFERENTIAL/PLATELET
Abs Immature Granulocytes: 0.02 10*3/uL (ref 0.00–0.07)
BASOS ABS: 0 10*3/uL (ref 0.0–0.1)
Basophils Relative: 0 %
EOS ABS: 0 10*3/uL (ref 0.0–0.5)
Eosinophils Relative: 1 %
HEMATOCRIT: 40.8 % (ref 36.0–46.0)
HEMOGLOBIN: 12.7 g/dL (ref 12.0–15.0)
Immature Granulocytes: 0 %
Lymphocytes Relative: 21 %
Lymphs Abs: 1.8 10*3/uL (ref 0.7–4.0)
MCH: 28 pg (ref 26.0–34.0)
MCHC: 31.1 g/dL (ref 30.0–36.0)
MCV: 89.9 fL (ref 80.0–100.0)
MONO ABS: 0.7 10*3/uL (ref 0.1–1.0)
MONOS PCT: 8 %
Neutro Abs: 5.8 10*3/uL (ref 1.7–7.7)
Neutrophils Relative %: 70 %
Platelets: 323 10*3/uL (ref 150–400)
RBC: 4.54 MIL/uL (ref 3.87–5.11)
RDW: 14.6 % (ref 11.5–15.5)
WBC: 8.3 10*3/uL (ref 4.0–10.5)
nRBC: 0 % (ref 0.0–0.2)

## 2018-03-23 LAB — URINALYSIS, MICROSCOPIC (REFLEX): WBC, UA: >50 WBC/hpf (ref 0–5)

## 2018-03-23 LAB — I-STAT BETA HCG BLOOD, ED (MC, WL, AP ONLY): I-stat hCG, quantitative: <5 m[IU]/mL (ref <5)

## 2018-03-23 LAB — LIPASE, BLOOD: LIPASE: 23 U/L (ref 11–51)

## 2018-03-23 MED ORDER — MORPHINE SULFATE (PF) 4 MG/ML IV SOLN
4.0000 mg | Freq: Once | INTRAVENOUS | Status: AC
Start: 2018-03-23 — End: 2018-03-23
  Administered 2018-03-23: 4 mg via INTRAMUSCULAR

## 2018-03-23 MED ORDER — IOHEXOL 300 MG/ML  SOLN
100.0000 mL | Freq: Once | INTRAMUSCULAR | Status: AC | PRN
  Administered 2018-03-23: 100 mL via INTRAVENOUS

## 2018-03-23 MED ORDER — HYDROCODONE-ACETAMINOPHEN 5-325 MG PO TABS: 1.0000 | ORAL_TABLET | Freq: Four times a day (QID) | ORAL | 0 refills | Status: DC | PRN

## 2018-03-23 MED ORDER — MORPHINE SULFATE (PF) 4 MG/ML IV SOLN
4.0000 mg | Freq: Once | INTRAVENOUS | Status: DC
Start: 2018-03-23 — End: 2018-03-23
  Filled 2018-03-23: qty 1

## 2018-03-23 MED ORDER — KETOROLAC TROMETHAMINE 15 MG/ML IJ SOLN
15.0000 mg | Freq: Once | INTRAMUSCULAR | Status: AC
  Administered 2018-03-23: 15 mg via INTRAVENOUS
  Filled 2018-03-23: qty 1

## 2018-03-23 MED ORDER — ONDANSETRON HCL 4 MG PO TABS: 4.0000 mg | ORAL_TABLET | Freq: Four times a day (QID) | ORAL | 0 refills | Status: DC

## 2018-03-23 MED ORDER — SUCRALFATE 1 GM/10ML PO SUSP
1.0000 g | Freq: Three times a day (TID) | ORAL | Status: DC
  Filled 2018-03-23 (×2): qty 10

## 2018-03-23 MED ORDER — ONDANSETRON HCL 4 MG/2ML IJ SOLN
4.0000 mg | Freq: Once | INTRAMUSCULAR | Status: AC
  Administered 2018-03-23: 4 mg via INTRAVENOUS
  Filled 2018-03-23: qty 2

## 2018-03-23 MED ORDER — MORPHINE SULFATE (PF) 4 MG/ML IV SOLN
4.0000 mg | Freq: Once | INTRAVENOUS | Status: AC
  Administered 2018-03-23: 4 mg via INTRAVENOUS
  Filled 2018-03-23: qty 1

## 2018-03-23 NOTE — ED Notes (Signed)
Patient transported to CT 

## 2018-03-23 NOTE — Telephone Encounter (Signed)
FYI: Received mychart request from patient to be seen today. Reason: per patient" I'm very sick to my stomach and my right side if my abdomen is in a lot of pain. I question my gall bladder or appendix. I've tried to call. Need appointment today." called patient on both numbers and left a message on cell phone number, was not able to leave a message on home number. Wanted to follow up with patient in details to see how to proceed. And sending mychart message to the patient.

## 2018-03-23 NOTE — Telephone Encounter (Signed)
Pt needs to got to UC or ED

## 2018-03-23 NOTE — ED Notes (Signed)
Patient transported to Ultrasound 

## 2018-03-23 NOTE — ED Notes (Signed)
Patient verbalizes understanding of discharge instructions. Opportunity for questioning and answers were provided. Armband removed by staff, pt discharged from ED ambulatory to home.  

## 2018-03-23 NOTE — ED Notes (Signed)
IV team bedside. 

## 2018-03-23 NOTE — Telephone Encounter (Signed)
Pt returned call for Anastasiya.

## 2018-03-23 NOTE — ED Notes (Signed)
This RN attempted to gain IV access x2 unsuccessfully. IV team consult placed.  

## 2018-03-23 NOTE — ED Triage Notes (Signed)
Pt here from home with c/o right side lower abd pain , also with c/o nausea

## 2018-03-23 NOTE — ED Provider Notes (Signed)
[facility] EMERGENCY DEPARTMENT Provider Note   CSN: [id number] Arrival date & time: 03/23/18  1232     History   Chief Complaint Chief Complaint  Patient presents with  . Abdominal Pain    HPI Alyssa Delgado is a 47 y.o. female presenting for evaluation of abdominal pain.  Patient states for the past several days, she has been having generalized abdominal pain.  This morning, the pain became severe in the right side.  It is constant and sharp.  She denies history of similar.  She has associated nausea without vomiting.  She denies fevers, chills, chest pain, shortness of breath, urinary symptoms, abnormal bowel movements.  She has not been able to tolerate any p.o. today, but was having no difficulty with p.o. in the past several days.  She denies history of abdominal problems or surgeries.  She has no medical problems, takes no medications daily.  She denies vaginal discharge.  She started her period 3 days ago, it was slightly late.  HPI  Past Medical History:  Diagnosis Date  . Chicken pox     Patient Active Problem List   Diagnosis Date Noted  . 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 ALLERGIC RHINITIS 10/19/2007    History reviewed. No pertinent surgical history.   OB History   None      Home Medications    Prior to Admission medications   Medication Sig Start Date End Date Taking? Authorizing Provider  Cholecalciferol (VITAMIN D3) 1000 UNITS CAPS Take 2 capsules by mouth daily.    [provider]  HYDROcodone-acetaminophen (NORCO/VICODIN) 5-325 MG tablet Take 1 tablet by mouth every 6 (six) hours as needed. 03/23/18   Tonette Koehne, PA-C  ondansetron (ZOFRAN) 4 MG tablet Take 1 tablet (4 mg total) by mouth every 6 (six) hours. 03/23/18   Jermell Holeman, PA-C  Vitamin D, Ergocalciferol, (DRISDOL) 50000 units CAPS capsule Take 1 capsule (50,000 Units total) by mouth every 7 (seven) days. 02/02/17   Lorre Munroe, NP    Family  History Family History  Problem Relation Age of Onset  . Colon cancer Mother   . Hypothyroidism Father   . Colon cancer Maternal Grandmother   . Prostate cancer Maternal Grandfather   . Hyperlipidemia Paternal Grandmother   . Heart disease Paternal Grandmother   . Stroke Paternal Grandmother   . Hypertension Paternal Grandmother     Social History Social History   Tobacco Use  . Smoking status: Never Smoker  . Smokeless tobacco: Never Used  Substance Use Topics  . Alcohol use: No  . Drug use: No     Allergies   Patient has no known allergies.   Review of Systems Review of Systems  Gastrointestinal: Positive for abdominal pain and nausea.  All other systems reviewed and are negative.    Physical Exam Updated Vital Signs BP 115/65   Pulse 79   Temp 98.3 F (36.8 C)   Resp 16   LMP 03/23/2018 (Exact Date)   SpO2 97%   Physical Exam  Constitutional: She is oriented to person, place, and time. She appears well-developed and well-nourished.  Patient appears very uncomfortable due to pain.  HENT:  Head: Normocephalic and atraumatic.  Eyes: Pupils are equal, round, and reactive to light. Conjunctivae and EOM are normal.  Neck: Normal range of motion. Neck supple.  Cardiovascular: Normal rate, regular rhythm and intact distal pulses.  Pulmonary/Chest: Effort normal and breath sounds normal. No respiratory distress. She has no wheezes.  Abdominal: Soft. She exhibits no distension and no mass. There is tenderness. There is guarding. There is no rebound.  Tenderness palpation of right sided abdomen, worse in the right upper quadrant.  Guarding in the right upper quadrant.  Negative rebound.  No rigidity or distention.  No signs of peritonitis.  No CVA tenderness.  No tenderness palpation of the suprapubic or lower abdomen.  Musculoskeletal: Normal range of motion.  Neurological: She is alert and oriented to person, place, and time.  Skin: Skin is warm and dry.    Psychiatric: She has a normal mood and affect.  Nursing note and vitals reviewed.    ED Treatments / Results  Labs (all labs ordered are listed, but only abnormal results are displayed) Labs Reviewed  COMPREHENSIVE METABOLIC PANEL - Abnormal; Notable for the following components:      Result Value   Chloride 112 (*)    CO2 21 (*)    Calcium 8.8 (*)    AST 14 (*)    All other components within normal limits  URINALYSIS, ROUTINE W REFLEX MICROSCOPIC - Abnormal; Notable for the following components:   Color, Urine RED (*)    APPearance TURBID (*)    Glucose, UA   (*)    Value: TEST NOT REPORTED DUE TO COLOR INTERFERENCE OF URINE PIGMENT   Hgb urine dipstick   (*)    Value: TEST NOT REPORTED DUE TO COLOR INTERFERENCE OF URINE PIGMENT   Bilirubin Urine   (*)    Value: TEST NOT REPORTED DUE TO COLOR INTERFERENCE OF URINE PIGMENT   Ketones, ur   (*)    Value: TEST NOT REPORTED DUE TO COLOR INTERFERENCE OF URINE PIGMENT   Protein, ur   (*)    Value: TEST NOT REPORTED DUE TO COLOR INTERFERENCE OF URINE PIGMENT   Nitrite   (*)    Value: TEST NOT REPORTED DUE TO COLOR INTERFERENCE OF URINE PIGMENT   Leukocytes, UA   (*)    Value: TEST NOT REPORTED DUE TO COLOR INTERFERENCE OF URINE PIGMENT   All other components within normal limits  URINALYSIS, MICROSCOPIC (REFLEX) - Abnormal; Notable for the following components:   Bacteria, UA FEW (*)    All other components within normal limits  CBC WITH DIFFERENTIAL/PLATELET  LIPASE, BLOOD  I-STAT BETA HCG BLOOD, ED ([facility], [facility], [facility] ONLY)    EKG None  Radiology Ct Abdomen Pelvis W Contrast  Result Date: 03/23/2018 CLINICAL DATA:  Right-sided abdominal pain for 2 days.  Nausea. EXAM: CT ABDOMEN AND PELVIS WITH CONTRAST TECHNIQUE: Multidetector CT imaging of the abdomen and pelvis was performed using the standard protocol following bolus administration of intravenous contrast. CONTRAST:  OMNIPAQUE IOHEXOL 300 MG/ML  SOLN COMPARISON:   Pelvic ultrasound 02/10/2017. FINDINGS: Lower chest: Clear lung bases. No significant pleural or pericardial effusion. Hepatobiliary: The liver is normal in density without focal abnormality. No evidence of gallstones, gallbladder wall thickening or biliary dilatation. Pancreas: Unremarkable. No pancreatic ductal dilatation or surrounding inflammatory changes. Spleen: Normal in size without focal abnormality. Adrenals/Urinary Tract: Both adrenal glands appear normal. The kidneys appear normal without evidence of urinary tract calculus, suspicious lesion or hydronephrosis. No bladder abnormalities are seen. Stomach/Bowel: No evidence of bowel wall thickening, distention or surrounding inflammatory change. The appendix appears normal. Vascular/Lymphatic: There are no enlarged abdominal or pelvic lymph nodes. No significant vascular findings. Reproductive: There is a 1.6 cm cervical nabothian cyst posteriorly, similar to previous ultrasound. The uterus otherwise appears normal. Low-density well-circumscribed  4.6 x 3.4 cm left adnexal lesion on image 71/3 is likely a simple ovarian cyst. There is no surrounding inflammation. The right ovary appears normal. Other: Small umbilical hernia containing only fat. No ascites or free air. Musculoskeletal: No acute or significant osseous findings. IMPRESSION: 1. No acute findings or explanation for the patient's symptoms. There is no evidence of bowel obstruction or appendicitis. 2. 4.6 cm benign-appearing left ovarian cyst. Per consensus guidelines, this requires no further evaluation. This recommendation follows ACR consensus guidelines: White Paper of the ACR Incidental Findings Committee II on Adnexal Findings. J Am Coll Radiol [id number]. Electronically Signed   By: Carey Bullocks M.D.   On: 03/23/2018 17:45   US Abdomen Limited Ruq  Result Date: 03/23/2018 CLINICAL DATA:  Acute abdominal pain for 4 days. EXAM: ULTRASOUND ABDOMEN LIMITED RIGHT UPPER QUADRANT  COMPARISON:  None. FINDINGS: Gallbladder: No gallstones or wall thickening visualized. No sonographic Murphy sign noted by sonographer. Common bile duct: Diameter: 4 mm Liver: Obscured left hepatic lobe due to overlying bowel gas. No space-occupying mass is noted. Portal vein is patent on color Doppler imaging with normal direction of blood flow towards the liver. IMPRESSION: No sonographic findings for the patient's right upper quadrant pain. Electronically Signed   By: Tollie Eth M.D.   On: 03/23/2018 19:52    Procedures Procedures (including critical care time)  Medications Ordered in ED Medications  sucralfate (CARAFATE) 1 GM/10ML suspension 1 g (has no administration in time range)  ondansetron (ZOFRAN) injection 4 mg (4 mg Intravenous Given 03/23/18 1655)  morphine 4 MG/ML injection 4 mg (4 mg Intramuscular Given 03/23/18 1446)  morphine 4 MG/ML injection 4 mg (4 mg Intravenous Given 03/23/18 1655)  iohexol (OMNIPAQUE) 300 MG/ML solution 100 mL (100 mLs Intravenous Contrast Given 03/23/18 1714)  ketorolac (TORADOL) 15 MG/ML injection 15 mg (15 mg Intravenous Given 03/23/18 2057)     Initial Impression / Assessment and Plan / ED Course  I have reviewed the triage vital signs and the nursing notes.  Pertinent labs & imaging results that were available during my care of the patient were reviewed by me and considered in my medical decision making (see chart for details).     Patient presenting for evaluation of abdominal pain and nausea.  Physical exam shows patient who appears very uncomfortable due to pain.  Right-sided abdominal pain with guarding on exam.  No rebound.  As patient has had generalized abdominal pain, with localization to the right and significant worsening of pain today, consider appendicitis.  Differential also includes cholelithiasis, cholecystitis, pancreatitis, ulcer, viral illness, kidney stone.  Lower suspicion for GU etiology, as pain is mostly in the upper  quadrant.  Labs reassuring, no leukocytosis.  Kidney, liver, pink reticulocyte function reassuring.  Urine nonspecific due to blood.  As patient does not have urinary symptoms, doubt UTI.  Pregnancy negative, doubt ectopic.  CT pending.  CT abdomen pelvis without acute findings.  No signs of discitis or kidney stones.  On reevaluation, patient's pain is improved, however she still has tenderness of the right upper quadrant.  As such, will obtain ultrasound to rule out gallbladder pathology.  Ultrasound negative for abnormalities of the gallbladder.  Discussed findings with patient.  Discussed that at this time, I do not know the specific cause for her pain, but there does not appear to be any life-threatening pathology.  No signs of intra-abdominal infection, perforation, obstruction, or surgical abdomen.  Discussed symptom medic treatment at home with pain medication and  antinausea medication.  Follow-up with PCP and/or GI as needed.  At this time, patient appears safe for discharge.  Return precautions given.  Patient states she understands and agrees to plan.  Final Clinical Impressions(s) / ED Diagnoses   Final diagnoses:  Acute abdominal pain  Non-intractable vomiting with nausea, unspecified vomiting type    ED Discharge Orders         Ordered    ondansetron (ZOFRAN) 4 MG tablet  Every 6 hours     03/23/18 2020    HYDROcodone-acetaminophen (NORCO/VICODIN) 5-325 MG tablet  Every 6 hours PRN     03/23/18 2020           Alveria ApleyCaccavale, Larnell Granlund, PA-C 03/23/18 2203    Doug SouJacubowitz, Sam, MD 03/26/18 1114

## 2018-03-23 NOTE — Discharge Instructions (Signed)
Take Tylenol as needed for mild to moderate pain.  Use Norco as needed severe or breakthrough pain.  Have caution while taking Norco, as this is a narcotic pain medicine, that may make you drowsy or groggy.  Do not drive while taking this medicine. Use Zofran as needed for nausea or vomiting. Follow-up with the stomach doctors for further evaluation as needed if your symptoms persist. Return to the emergency room if you develop high fevers, persistent vomiting despite medication, severe/worsening pain despite medication, or any new or concerning symptoms.

## 2018-03-23 NOTE — ED Notes (Signed)
Pt given water to drink. 

## 2018-04-02 ENCOUNTER — Other Ambulatory Visit (HOSPITAL_COMMUNITY): Payer: Self-pay | Admitting: Gastroenterology

## 2018-04-02 DIAGNOSIS — R109 Unspecified abdominal pain: Secondary | ICD-10-CM

## 2018-04-11 ENCOUNTER — Encounter (HOSPITAL_COMMUNITY): Payer: Self-pay

## 2018-04-11 ENCOUNTER — Ambulatory Visit (HOSPITAL_COMMUNITY)
Admission: RE | Admit: 2018-04-11 | Discharge: 2018-04-11 | Disposition: A | Discharge status: Home / Self Care | Payer: Managed Care, Other (non HMO) | Source: Ambulatory Visit | Attending: Gastroenterology | Admitting: Gastroenterology

## 2018-04-11 DIAGNOSIS — R109 Unspecified abdominal pain: Secondary | ICD-10-CM | POA: Insufficient documentation

## 2018-04-11 MED ORDER — TECHNETIUM TC 99M MEBROFENIN IV KIT
5.0000 | PACK | Freq: Once | INTRAVENOUS | Status: AC | PRN
  Administered 2018-04-11: 5 via INTRAVENOUS

## 2018-04-20 ENCOUNTER — Encounter: Payer: Self-pay | Admitting: Internal Medicine

## 2018-05-09 ENCOUNTER — Encounter: Payer: Self-pay | Admitting: Internal Medicine

## 2018-05-10 ENCOUNTER — Encounter: Payer: Managed Care, Other (non HMO) | Admitting: Internal Medicine

## 2018-06-27 ENCOUNTER — Other Ambulatory Visit (HOSPITAL_COMMUNITY)
Admission: RE | Admit: 2018-06-27 | Discharge: 2018-06-27 | Disposition: A | Discharge status: Home / Self Care | Payer: Managed Care, Other (non HMO) | Source: Ambulatory Visit | Attending: Internal Medicine | Admitting: Internal Medicine

## 2018-06-27 ENCOUNTER — Ambulatory Visit (INDEPENDENT_AMBULATORY_CARE_PROVIDER_SITE_OTHER): Payer: Managed Care, Other (non HMO) | Admitting: Internal Medicine

## 2018-06-27 ENCOUNTER — Encounter: Payer: Self-pay | Admitting: Internal Medicine

## 2018-06-27 VITALS — BP 124/84 | HR 77 | Temp 98.0°F | Ht 67.5 in | Wt 222.0 lb

## 2018-06-27 DIAGNOSIS — Z113 Encounter for screening for infections with a predominantly sexual mode of transmission: Secondary | ICD-10-CM

## 2018-06-27 DIAGNOSIS — Z23 Encounter for immunization: Secondary | ICD-10-CM

## 2018-06-27 DIAGNOSIS — Z1239 Encounter for other screening for malignant neoplasm of breast: Secondary | ICD-10-CM

## 2018-06-27 DIAGNOSIS — Z124 Encounter for screening for malignant neoplasm of cervix: Secondary | ICD-10-CM | POA: Insufficient documentation

## 2018-06-27 DIAGNOSIS — Z0000 Encounter for general adult medical examination without abnormal findings: Secondary | ICD-10-CM | POA: Diagnosis not present

## 2018-06-27 LAB — COMPREHENSIVE METABOLIC PANEL
ALBUMIN: 4 g/dL (ref 3.5–5.2)
ALT: 10 U/L (ref 0–35)
AST: 11 U/L (ref 0–37)
Alkaline Phosphatase: 84 U/L (ref 39–117)
BUN: 18 mg/dL (ref 6–23)
CALCIUM: 8.8 mg/dL (ref 8.4–10.5)
CHLORIDE: 108 meq/L (ref 96–112)
CO2: 27 mEq/L (ref 19–32)
CREATININE: 0.99 mg/dL (ref 0.40–1.20)
GFR: 60.55 mL/min (ref >60.00)
Glucose, Bld: 98 mg/dL (ref 70–99)
POTASSIUM: 3.7 meq/L (ref 3.5–5.1)
Sodium: 141 mEq/L (ref 135–145)
Total Bilirubin: 0.3 mg/dL (ref 0.2–1.2)
Total Protein: 6.7 g/dL (ref 6.0–8.3)

## 2018-06-27 LAB — CBC
HEMATOCRIT: 36.6 % (ref 36.0–46.0)
HEMOGLOBIN: 12.3 g/dL (ref 12.0–15.0)
MCHC: 33.5 g/dL (ref 30.0–36.0)
MCV: 87.9 fl (ref 78.0–100.0)
PLATELETS: 285 10*3/uL (ref 150.0–400.0)
RBC: 4.16 Mil/uL (ref 3.87–5.11)
RDW: 14.4 % (ref 11.5–15.5)
WBC: 8.7 10*3/uL (ref 4.0–10.5)

## 2018-06-27 LAB — LIPID PANEL
CHOLESTEROL: 133 mg/dL (ref 0–200)
HDL: 42.8 mg/dL (ref >39.00)
LDL CALC: 72 mg/dL (ref 0–99)
NonHDL: 90.37
TRIGLYCERIDES: 94 mg/dL (ref 0.0–149.0)
Total CHOL/HDL Ratio: 3
VLDL: 18.8 mg/dL (ref 0.0–40.0)

## 2018-06-27 NOTE — Patient Instructions (Signed)

## 2018-06-27 NOTE — Addendum Note (Signed)
Addended by: Roena Malady on: 06/27/2018 04:53 PM   Modules accepted: Orders

## 2018-06-27 NOTE — Progress Notes (Signed)
Subjective:    Patient ID: Alyssa Delgado, female    DOB: 06/01/1970, 48 y.o.   MRN: [id number]  HPI  Pt presents to the clinic today for her annual exam.  Flu: never Tetanus: 10 years ago Pap Smear: 2015 Mammogram: 2015 Colon Screening: 04/2018 Vision Screening: as needed Dentist: biannually  Diet: She does eat some meat. She consumes fruits and veggies daily. She rarely eats fried foods. She drinks mostly water, some sweet tea. Exercise: None  Review of Systems      Past Medical History:  Diagnosis Date  . Chicken pox     Current Outpatient Medications  Medication Sig Dispense Refill  . Cholecalciferol (VITAMIN D3) 1000 UNITS CAPS Take 2 capsules by mouth daily.     No current facility-administered medications for this visit.     No Known Allergies  Family History  Problem Relation Age of Onset  . Colon cancer Mother   . Hypothyroidism Father   . Colon cancer Maternal Grandmother   . Prostate cancer Maternal Grandfather   . Hyperlipidemia Paternal Grandmother   . Heart disease Paternal Grandmother   . Stroke Paternal Grandmother   . Hypertension Paternal Grandmother     Social History   Socioeconomic History  . Marital status: Married    Spouse name: Not on file  . Number of children: Not on file  . Years of education: Not on file  . Highest education level: Not on file  Occupational History  . Not on file  Social Needs  . Financial resource strain: Not on file  . Food insecurity:    Worry: Not on file    Inability: Not on file  . Transportation needs:    Medical: Not on file    Non-medical: Not on file  Tobacco Use  . Smoking status: Never Smoker  . Smokeless tobacco: Never Used  Substance and Sexual Activity  . Alcohol use: No  . Drug use: No  . Sexual activity: Yes  Lifestyle  . Physical activity:    Days per week: Not on file    Minutes per session: Not on file  . Stress: Not on file  Relationships  . Social connections:    Talks  on phone: Not on file    Gets together: Not on file    Attends religious service: Not on file    Active member of club or organization: Not on file    Attends meetings of clubs or organizations: Not on file    Relationship status: Not on file  . Intimate partner violence:    Fear of current or ex partner: Not on file    Emotionally abused: Not on file    Physically abused: Not on file    Forced sexual activity: Not on file  Other Topics Concern  . Not on file  Social History Narrative  . Not on file     Constitutional: Denies fever, malaise, fatigue, headache or abrupt weight changes.  HEENT: Denies eye pain, eye redness, ear pain, ringing in the ears, wax buildup, runny nose, nasal congestion, bloody nose, or sore throat. Respiratory: Denies difficulty breathing, shortness of breath, cough or sputum production.   Cardiovascular: Denies chest pain, chest tightness, palpitations or swelling in the hands or feet.  Gastrointestinal: Pt reports intermittent reflux. Denies abdominal pain, bloating, constipation, diarrhea or blood in the stool.  GU: Denies urgency, frequency, pain with urination, burning sensation, blood in urine, odor or discharge. Musculoskeletal: Denies decrease in range of  motion, difficulty with gait, muscle pain or joint pain and swelling.  Skin: Denies redness, rashes, lesions or ulcercations.  Neurological: Denies dizziness, difficulty with memory, difficulty with speech or problems with balance and coordination.  Psych: Denies anxiety, depression, SI/HI.  No other specific complaints in a complete review of systems (except as listed in HPI above).  Objective:   Physical Exam    BP 124/84   Pulse 77   Temp 98 F (36.7 C) (Oral)   Ht 5' 7.5" (1.715 m)   Wt 222 lb (100.7 kg)   LMP 06/01/2018   SpO2 98%   BMI 34.26 kg/m  Wt Readings from Last 3 Encounters:  06/27/18 222 lb (100.7 kg)  01/31/17 222 lb (100.7 kg)  11/21/14 220 lb (99.8 kg)    General:  Appears her stated age, obese, in NAD. Skin: Warm, dry and intact.  HEENT: Head: normal shape and size; Eyes: sclera white, no icterus, conjunctiva pink, PERRLA and EOMs intact; Ears: Tm's gray and intact, normal light reflex;  Throat/Mouth: Teeth present, mucosa pink and moist, no exudate, lesions or ulcerations noted.  Neck:  Neck supple, trachea midline. No masses, lumps or thyromegaly present.  Cardiovascular: Normal rate and rhythm. S1,S2 noted.  No murmur, rubs or gallops noted. No JVD or BLE edema.  Pulmonary/Chest: Normal effort and positive vesicular breath sounds. No respiratory distress. No wheezes, rales or ronchi noted.  Abdomen: Soft and nontender. Normal bowel sounds. No distention or masses noted. Liver, spleen and kidneys non palpable. Pelvic: Normal female anatomy. Cervix with changes around the os. No CMT. Adnexa non palpable.  Musculoskeletal: Strength 5/5 BUE/BLE. No difficulty with gait.  Neurological: Alert and oriented. Cranial nerves II-XII grossly intact. Coordination normal.  Psychiatric: Mood and affect normal. Behavior is normal. Judgment and thought content normal.    BMET    Component Value Date/Time   NA 142 03/23/2018 1538   K 3.5 03/23/2018 1538   CL 112 (H) 03/23/2018 1538   CO2 21 (L) 03/23/2018 1538   GLUCOSE 93 03/23/2018 1538   BUN 9 03/23/2018 1538   CREATININE 0.85 03/23/2018 1538   CALCIUM 8.8 (L) 03/23/2018 1538   GFRNONAA >60 03/23/2018 1538   GFRAA >60 03/23/2018 1538    Lipid Panel     Component Value Date/Time   CHOL 146 06/21/2013 1422   TRIG 134.0 06/21/2013 1422   HDL 42.70 06/21/2013 1422   CHOLHDL 3 06/21/2013 1422   VLDL 26.8 06/21/2013 1422   LDLCALC 77 06/21/2013 1422    CBC    Component Value Date/Time   WBC 8.3 03/23/2018 1538   RBC 4.54 03/23/2018 1538   HGB 12.7 03/23/2018 1538   HCT 40.8 03/23/2018 1538   PLT 323 03/23/2018 1538   MCV 89.9 03/23/2018 1538   MCH 28.0 03/23/2018 1538   MCHC 31.1 03/23/2018 1538    RDW 14.6 03/23/2018 1538   LYMPHSABS 1.8 03/23/2018 1538   MONOABS 0.7 03/23/2018 1538   EOSABS 0.0 03/23/2018 1538   BASOSABS 0.0 03/23/2018 1538    Hgb A1C Lab Results  Component Value Date   HGBA1C 5.8 06/21/2013          Assessment & Plan:   Preventative Health Maintenance:  She declines flu shot today Tetanus today Pap smear today with STD screening Mammogram ordered, she will call [facility] to schedule, number provided Colonoscopy UTD Encouraged her to consume a balanced diet and exercise regimen Advised her to see an eye doctor and dentist annually Will  check CBC, CMET, Lipid and Vit D today  RTC in 1 year, sooner if needed Nicki Reaper, NP

## 2018-06-27 NOTE — Addendum Note (Signed)
Addended by: Roena Malady on: 06/27/2018 03:50 PM   Modules accepted: Orders

## 2018-06-28 LAB — VITAMIN D 25 HYDROXY (VIT D DEFICIENCY, FRACTURES): VITD: 27.7 ng/mL — ABNORMAL LOW (ref 30.00–100.00)

## 2018-07-03 ENCOUNTER — Encounter: Payer: Self-pay | Admitting: Internal Medicine

## 2018-07-03 LAB — CYTOLOGY - PAP
Chlamydia: NEGATIVE
Diagnosis: UNDETERMINED — AB
HPV: NOT DETECTED
NEISSERIA GONORRHEA: NEGATIVE
Trichomonas: NEGATIVE

## 2019-01-20 IMAGING — NM NM HEPATO W/GB/PHARM/[PERSON_NAME]
1 series · 1 of 1 positions shown · non-contrast
Comparison: None.

CLINICAL DATA: Upper abdominal pain

EXAM:
NUCLEAR MEDICINE HEPATOBILIARY IMAGING WITH GALLBLADDER EF
VIEWS:
Anterior, right lateral right upper quadrant
RADIOPHARMACEUTICALS:  5.0 mCi Yc-MMm  Choletec IV

[he hepatobiliary · 2.26mm/px · 1 of 1 slices shown]
[im 1/1]
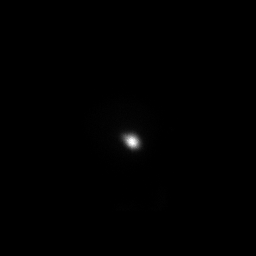

[1 of 1 positions shown; findings below may reference images not displayed]

FINDINGS: Liver uptake of radiotracer is unremarkable. There is prompt
visualization of gallbladder and small bowel, indicating patency of
the cystic and common bile ducts. The patient consumed 8 ounces of
Ensure orally with calculation of the computer generated ejection
fraction of radiotracer from the gallbladder. The patient
experienced pain with the oral Ensure consumption. The computer
generated ejection fraction of radiotracer from the gallbladder is
normal at 75%, normal greater than 33% using the oral agent.
IMPRESSION: Normal ejection fraction of radiotracer from the gallbladder. The
patient did experience clinical symptoms with the oral Ensure
consumption. Cystic and common bile ducts are patent as is evidenced
by visualization gallbladder and small bowel.

## 2019-03-05 ENCOUNTER — Encounter: Payer: Self-pay | Admitting: Internal Medicine

## 2019-09-19 LAB — HM MAMMOGRAPHY

## 2020-01-06 IMAGING — US US ABDOMEN LIMITED
1 series · 14 of 14 positions shown · non-contrast
Comparison: None.

CLINICAL DATA: Acute abdominal pain for 4 days.

EXAM:
ULTRASOUND ABDOMEN LIMITED RIGHT UPPER QUADRANT

[Series 1: us abdomen limited · 0.22mm/px · 14 of 14 slices shown]
[im 1/14]
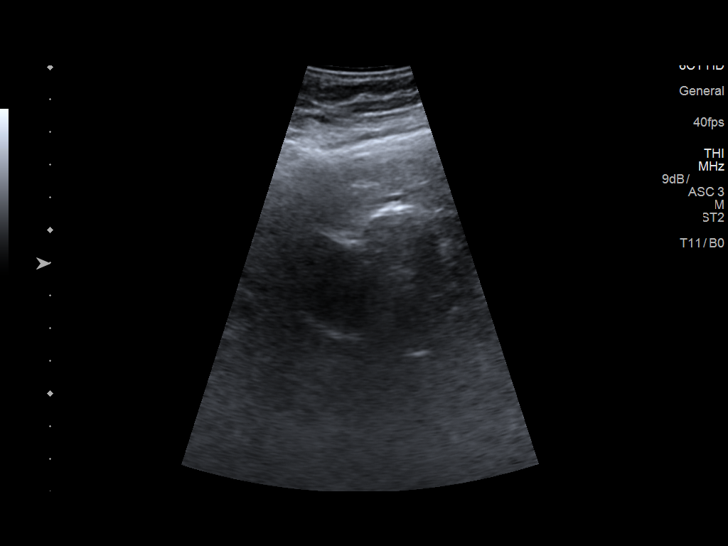
[im 2/14]
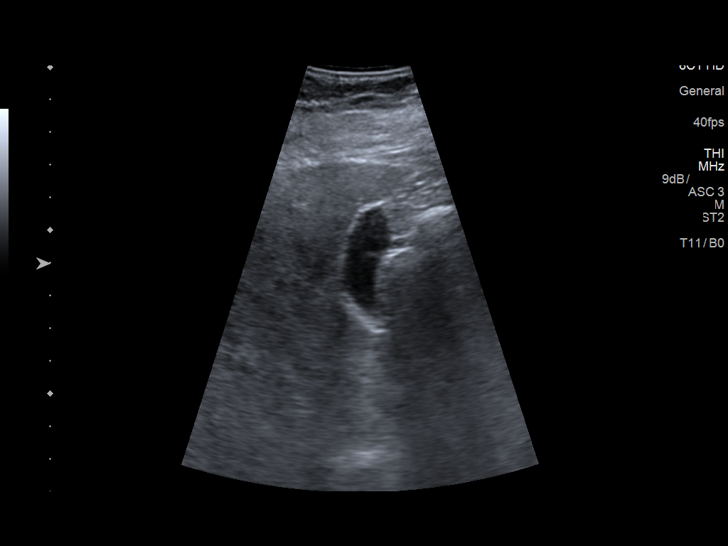
[im 3/14]
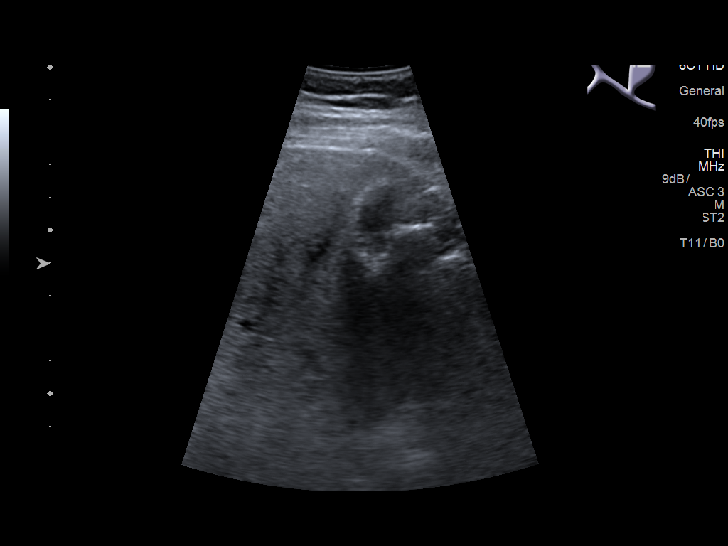
[im 4/14]
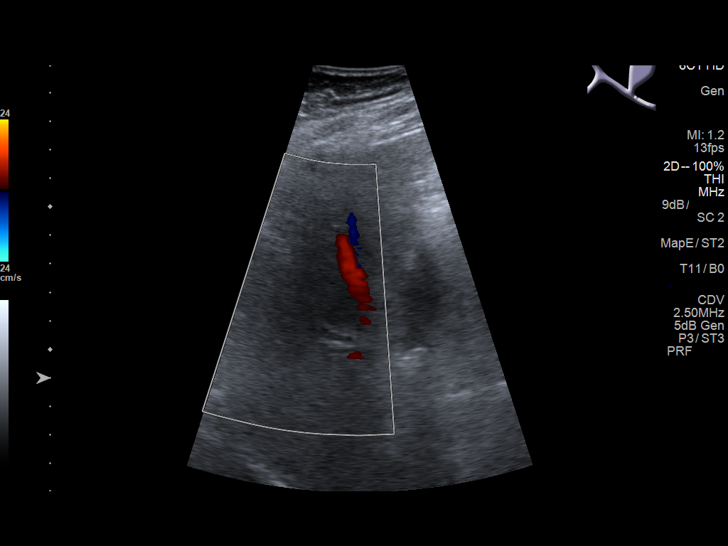
[im 5/14]
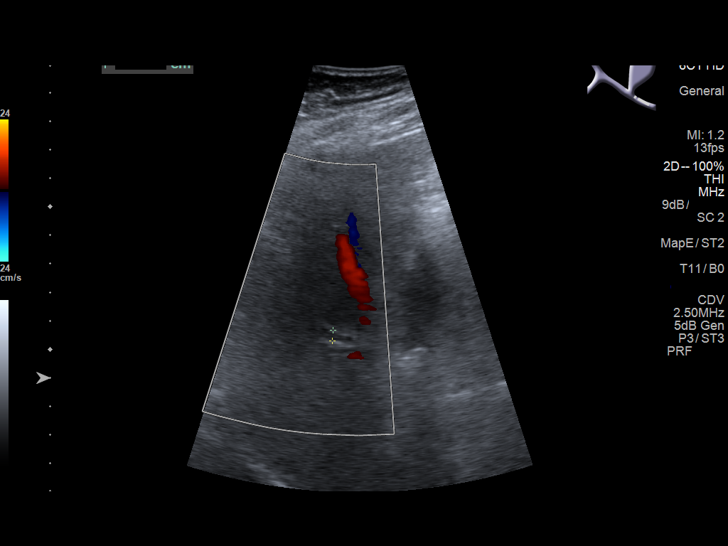
[im 6/14]
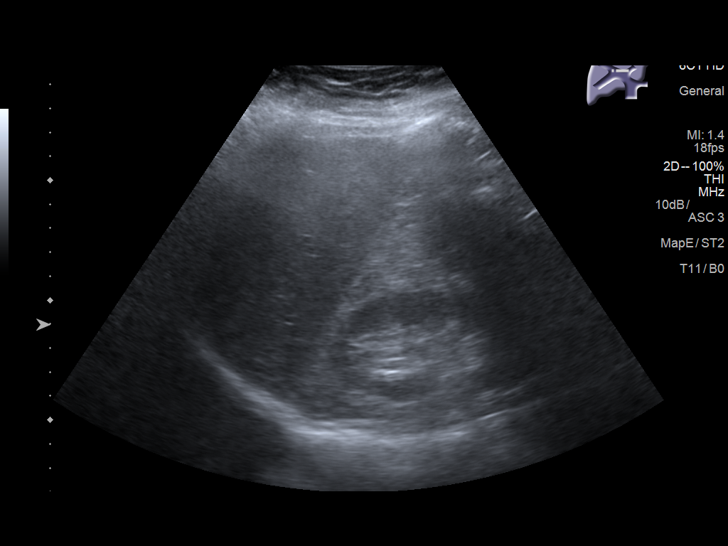
[im 7/14]
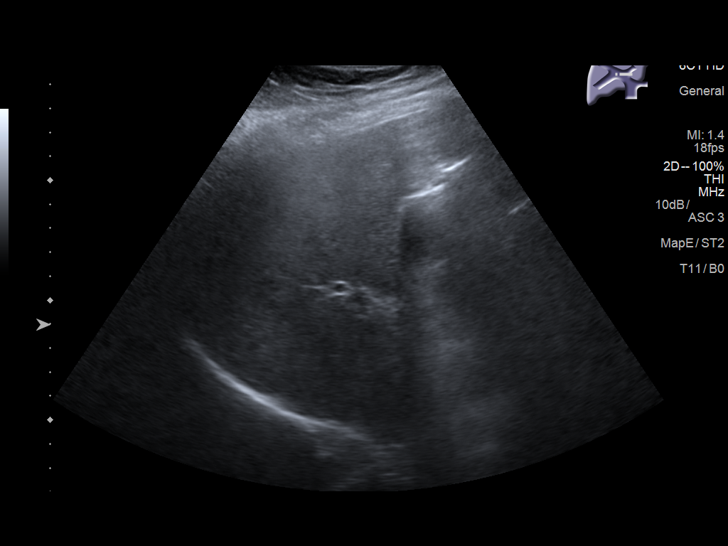
[im 8/14]
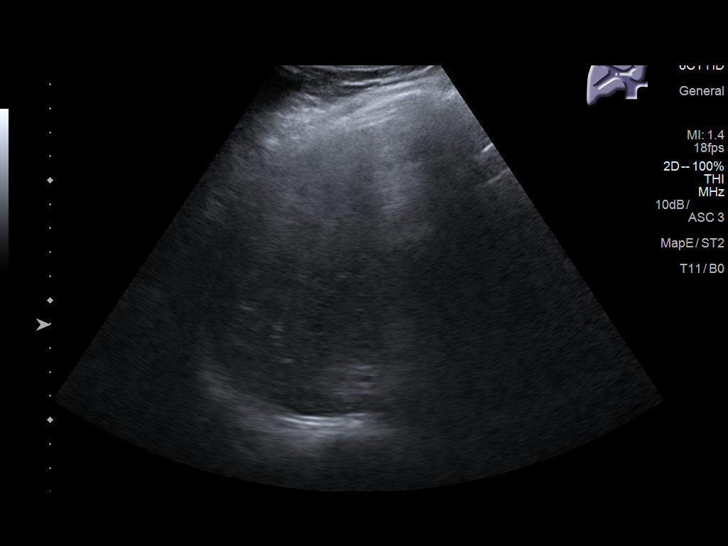
[im 9/14]
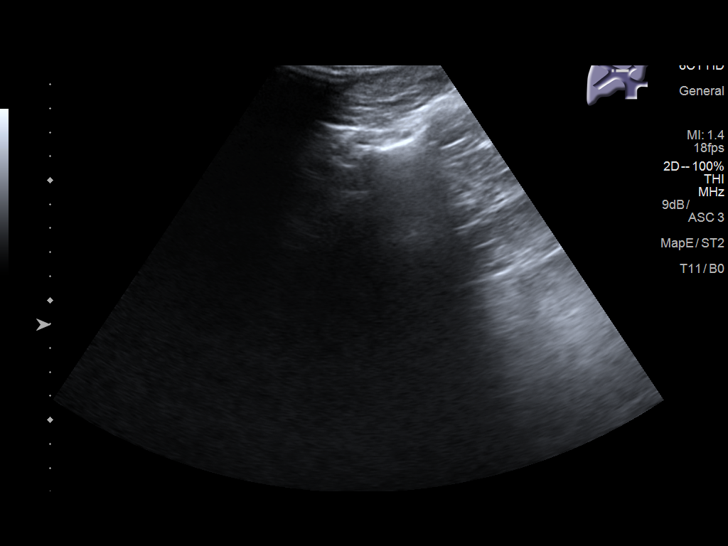
[im 10/14]
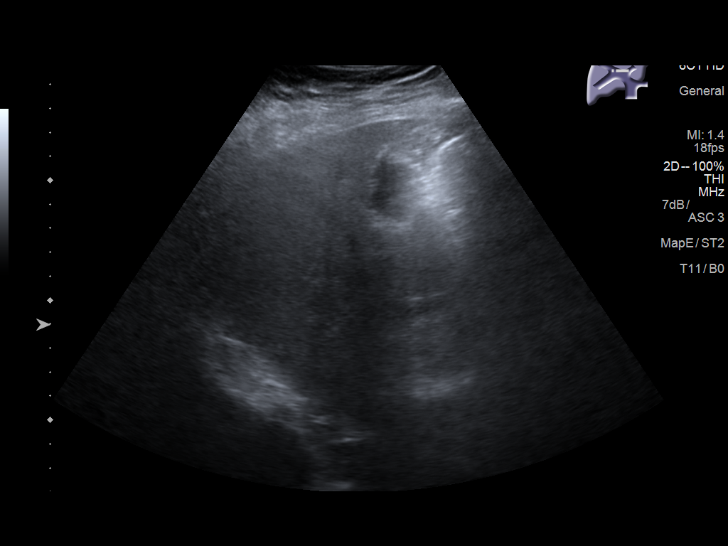
[im 11/14]
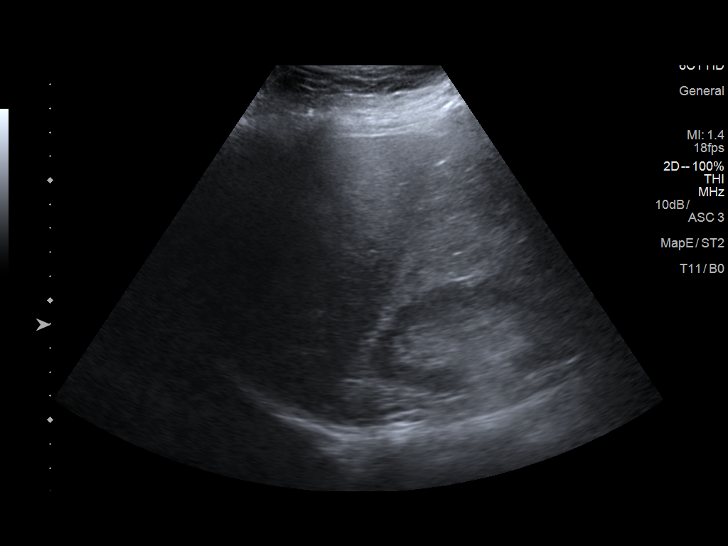
[im 12/14]
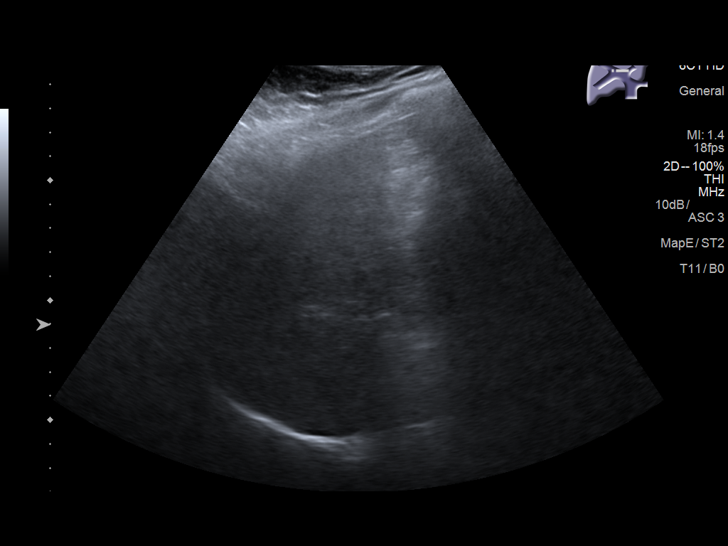
[im 13/14]
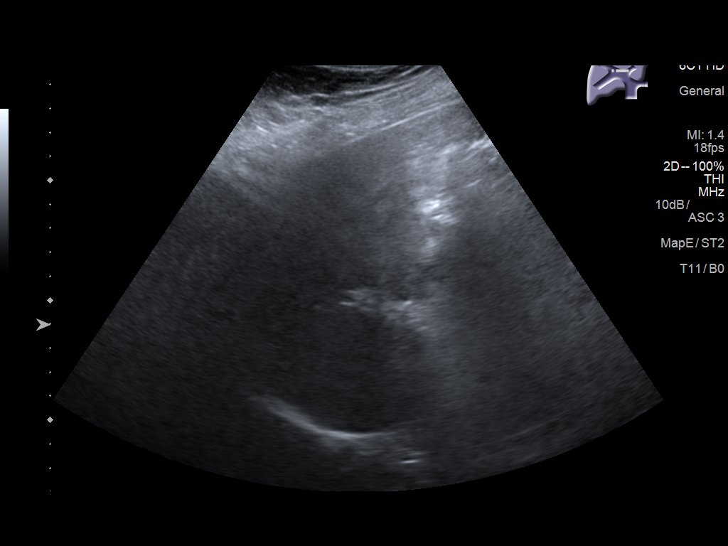
[im 14/14]
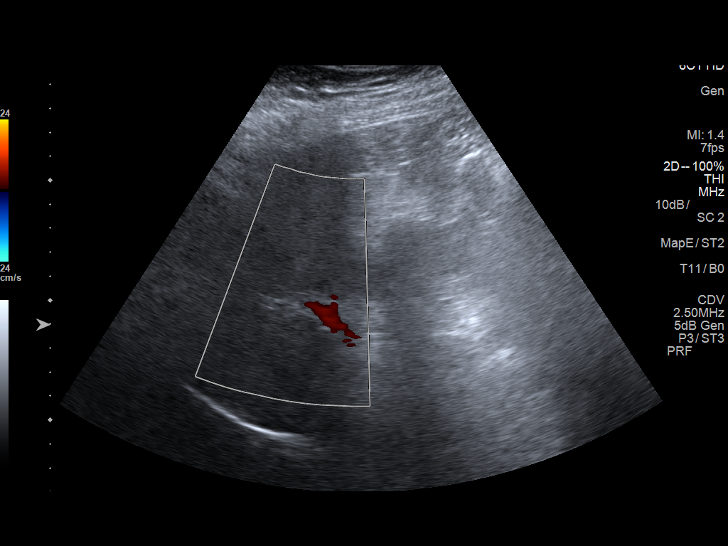

[14 of 14 positions shown; findings below may reference images not displayed]

FINDINGS: Gallbladder:

No gallstones or wall thickening visualized. No sonographic Murphy
sign noted by sonographer.

Common bile duct:

Diameter: 4 mm

Liver:

Obscured left hepatic lobe due to overlying bowel gas. No
space-occupying mass is noted. Portal vein is patent on color
Doppler imaging with normal direction of blood flow towards the
liver.
IMPRESSION: No sonographic findings for the patient's right upper quadrant pain.

## 2020-03-24 ENCOUNTER — Telehealth: Payer: Self-pay | Admitting: *Deleted

## 2020-03-24 NOTE — Telephone Encounter (Signed)
Patient called stating that she was exposed to someone that tested positive for covid yesterday. Patient wants to know when she should get tested. Patient stated that she has not been vaccinated. Patient was advised that she should get tested in about 5 days after exposure unless she develops symptoms sooner. Patient stated that would put her getting tested Saturday. Patient was given information on locations that do the testing. Patient was advised that she should quarantine until she gets her test results back. Patient was given ER precautions should she develop any severe symptoms and she verbalized understanding.

## 2020-03-24 NOTE — Telephone Encounter (Signed)
Noted, agree with advice given 

## 2020-05-06 ENCOUNTER — Encounter: Payer: Self-pay | Admitting: Internal Medicine

## 2022-04-27 ENCOUNTER — Ambulatory Visit (INDEPENDENT_AMBULATORY_CARE_PROVIDER_SITE_OTHER): Payer: Managed Care, Other (non HMO) | Admitting: Family Medicine

## 2022-04-27 ENCOUNTER — Encounter: Payer: Self-pay | Admitting: Family Medicine

## 2022-04-27 VITALS — BP 128/82 | HR 66 | Temp 97.8°F | Ht 68.0 in | Wt 215.0 lb

## 2022-04-27 DIAGNOSIS — Z862 Personal history of diseases of the blood and blood-forming organs and certain disorders involving the immune mechanism: Secondary | ICD-10-CM

## 2022-04-27 DIAGNOSIS — Z1211 Encounter for screening for malignant neoplasm of colon: Secondary | ICD-10-CM

## 2022-04-27 DIAGNOSIS — Z1231 Encounter for screening mammogram for malignant neoplasm of breast: Secondary | ICD-10-CM

## 2022-04-27 DIAGNOSIS — Z0000 Encounter for general adult medical examination without abnormal findings: Secondary | ICD-10-CM

## 2022-04-27 DIAGNOSIS — Z8639 Personal history of other endocrine, nutritional and metabolic disease: Secondary | ICD-10-CM | POA: Diagnosis not present

## 2022-04-27 LAB — CBC WITH DIFFERENTIAL/PLATELET
Absolute Monocytes: 431 cells/uL (ref 200–950)
Basophils Absolute: 22 cells/uL (ref 0–200)
HCT: 41.8 % (ref 35.0–45.0)
MCH: 29.9 pg (ref 27.0–33.0)
MCV: 88.6 fL (ref 80.0–100.0)
MPV: 10.6 fL (ref 7.5–12.5)
Monocytes Relative: 9.8 %
Neutrophils Relative %: 51.8 %
WBC: 4.4 10*3/uL (ref 3.8–10.8)

## 2022-04-27 NOTE — Assessment & Plan Note (Signed)

## 2022-04-27 NOTE — Progress Notes (Signed)
New Patient Office Visit  Subjective    Patient ID: Alyssa Delgado, female    DOB: April 03, 1971  Age: 51 y.o. MRN: [id number]  CC:  Chief Complaint  Patient presents with   Establish Care    HPI Alyssa Delgado presents to establish care. Oriented to practice routines and expectations. She has no significant PMH or PSH and no concerns today. See below for health maintenance items. Her health insurance is changing soon so will address items today and schedule after the change over per patient request.  PAP ASCUS 04/20/22, repeat 3y- due Colon cancer ordered Mammogram ordered Vaccines declined Denies tobacco, ETOH, drugs, STI   Outpatient Encounter Medications as of 04/27/2022  Medication Sig   [DISCONTINUED] Cholecalciferol (VITAMIN D3) 1000 UNITS CAPS Take 2 capsules by mouth daily. (Patient not taking: Reported on 04/27/2022)   No facility-administered encounter medications on file as of 04/27/2022.    Past Medical History:  Diagnosis Date   Chicken pox     History reviewed. No pertinent surgical history.  Family History  Problem Relation Age of Onset   Colon cancer Mother    Hypothyroidism Father    Colon cancer Maternal Grandmother    Prostate cancer Maternal Grandfather    Hyperlipidemia Paternal Grandmother    Heart disease Paternal Grandmother    Stroke Paternal Grandmother    Hypertension Paternal Grandmother     Social History   Socioeconomic History   Marital status: Married    Spouse name: Not on file   Number of children: Not on file   Years of education: Not on file   Highest education level: Not on file  Occupational History   Not on file  Tobacco Use   Smoking status: Never   Smokeless tobacco: Never  Substance and Sexual Activity   Alcohol use: No   Drug use: No   Sexual activity: Yes  Other Topics Concern   Not on file  Social History Narrative   Not on file   Social Determinants of Health   Financial Resource Strain: Not on file   Food Insecurity: Not on file  Transportation Needs: Not on file  Physical Activity: Not on file  Stress: Not on file  Social Connections: Not on file  Intimate Partner Violence: Not on file    Review of Systems  Constitutional: Negative.   HENT: Negative.    Eyes: Negative.   Respiratory: Negative.    Cardiovascular: Negative.   Gastrointestinal: Negative.   Genitourinary: Negative.   Musculoskeletal: Negative.   Skin: Negative.   Neurological: Negative.   Endo/Heme/Allergies: Negative.   Psychiatric/Behavioral: Negative.    All other systems reviewed and are negative.       Objective    BP 128/82 (BP Location: Right Arm, Patient Position: Sitting, Cuff Size: Normal)   Pulse 66   Temp 97.8 F (36.6 C) (Oral)   Ht 5\' 8"  (1.727 m)   Wt 215 lb (97.5 kg)   SpO2 95%   BMI 32.69 kg/m      04/27/2022    9:22 AM 04/27/2022    9:02 AM 04/27/2022    8:57 AM  Vitals with BMI  Height   5\' 8"   Weight   215 lbs  BMI   32.7  Systolic 128 132 04/29/2022  Diastolic 82 92 100  Pulse   66    Physical Exam Vitals and nursing note reviewed.  Constitutional:      Appearance: Normal appearance. She is normal weight.  HENT:     Head: Normocephalic and atraumatic.     Right Ear: Tympanic membrane, ear canal and external ear normal.     Left Ear: Tympanic membrane, ear canal and external ear normal.     Nose: Nose normal.     Mouth/Throat:     Mouth: Mucous membranes are moist.     Pharynx: Oropharynx is clear.  Eyes:     Extraocular Movements: Extraocular movements intact.     Conjunctiva/sclera: Conjunctivae normal.     Pupils: Pupils are equal, round, and reactive to light.  Cardiovascular:     Rate and Rhythm: Normal rate and regular rhythm.     Pulses: Normal pulses.     Heart sounds: Normal heart sounds.  Pulmonary:     Effort: Pulmonary effort is normal.     Breath sounds: Normal breath sounds.  Abdominal:     General: Bowel sounds are normal.     Palpations:  Abdomen is soft.  Musculoskeletal:        General: Normal range of motion.     Cervical back: Normal range of motion and neck supple.  Skin:    General: Skin is warm and dry.     Capillary Refill: Capillary refill takes less than 2 seconds.  Neurological:     General: No focal deficit present.     Mental Status: She is alert and oriented to person, place, and time. Mental status is at baseline.  Psychiatric:        Mood and Affect: Mood normal.        Behavior: Behavior normal.        Thought Content: Thought content normal.        Judgment: Judgment normal.         Assessment & Plan:   Problem List Items Addressed This Visit       Other   Physical exam, annual - Primary    Today your medical history was reviewed and routine physical exam with labs was performed. Recommend 150 minutes of moderate intensity exercise weekly and consuming a well-balanced diet. Advised to stop smoking if a smoker, avoid smoking if a non-smoker, limit alcohol consumption to 1 drink per day for women and 2 drinks per day for men, and avoid illicit drug use. Counseled on safe sex practices and offered STI testing today. Counseled on the importance of sunscreen use. Counseled in mental health awareness and when to seek medical care. Vaccine maintenance discussed. Appropriate health maintenance items reviewed. Return to office in 1 year for annual physical exam.       Relevant Orders   CBC with Differential/Platelet   COMPLETE METABOLIC PANEL WITH GFR   Lipid panel   TSH   Other Visit Diagnoses     Colon cancer screening       Relevant Orders   Ambulatory referral to Gastroenterology   Encounter for screening mammogram for malignant neoplasm of breast       Relevant Orders   MM DIGITAL SCREENING BILATERAL   History of vitamin D deficiency       Relevant Orders   VITAMIN D 25 Hydroxy (Vit-D Deficiency, Fractures)   History of anemia due to vitamin B12 deficiency       Relevant Orders    Vitamin B12       Return in about 1 month (around 05/28/2022) for PAP.   Park Meo, FNP

## 2022-04-28 LAB — COMPLETE METABOLIC PANEL WITH GFR
AG Ratio: 1.8 (calc) (ref 1.0–2.5)
ALT: 15 U/L (ref 6–29)
AST: 13 U/L (ref 10–35)
Albumin: 4.4 g/dL (ref 3.6–5.1)
Alkaline phosphatase (APISO): 93 U/L (ref 31–125)
BUN: 14 mg/dL (ref 7–25)
CO2: 27 mmol/L (ref 20–32)
Calcium: 9.4 mg/dL (ref 8.6–10.2)
Chloride: 107 mmol/L (ref 98–110)
Creat: 0.91 mg/dL (ref 0.50–0.99)
Globulin: 2.5 g/dL (calc) (ref 1.9–3.7)
Glucose, Bld: 99 mg/dL (ref 65–99)
Potassium: 4.4 mmol/L (ref 3.5–5.3)
Sodium: 141 mmol/L (ref 135–146)
Total Bilirubin: 0.5 mg/dL (ref 0.2–1.2)
Total Protein: 6.9 g/dL (ref 6.1–8.1)
eGFR: 77 mL/min/{1.73_m2} (ref >=60)

## 2022-04-28 LAB — LIPID PANEL
Cholesterol: 174 mg/dL (ref <200)
HDL: 61 mg/dL (ref >=50)
LDL Cholesterol (Calc): 98 mg/dL (calc)
Non-HDL Cholesterol (Calc): 113 mg/dL (calc) (ref <130)
Total CHOL/HDL Ratio: 2.9 (calc) (ref <5.0)
Triglycerides: 68 mg/dL (ref <150)

## 2022-04-28 LAB — VITAMIN B12: Vitamin B-12: 272 pg/mL (ref 200–1100)

## 2022-04-28 LAB — CBC WITH DIFFERENTIAL/PLATELET
Basophils Relative: 0.5 %
Eosinophils Absolute: 150 cells/uL (ref 15–500)
Eosinophils Relative: 3.4 %
Hemoglobin: 14.1 g/dL (ref 11.7–15.5)
Lymphs Abs: 1518 cells/uL (ref 850–3900)
MCHC: 33.7 g/dL (ref 32.0–36.0)
Neutro Abs: 2279 cells/uL (ref 1500–7800)
Platelets: 280 10*3/uL (ref 140–400)
RBC: 4.72 10*6/uL (ref 3.80–5.10)
RDW: 13.9 % (ref 11.0–15.0)
Total Lymphocyte: 34.5 %

## 2022-04-28 LAB — TSH: TSH: 1.61 mIU/L

## 2022-04-28 LAB — VITAMIN D 25 HYDROXY (VIT D DEFICIENCY, FRACTURES): Vit D, 25-Hydroxy: 29 ng/mL — ABNORMAL LOW (ref 30–100)

## 2022-07-07 ENCOUNTER — Encounter: Payer: Self-pay | Admitting: Family Medicine

## 2022-07-08 ENCOUNTER — Other Ambulatory Visit: Payer: Self-pay

## 2022-07-08 DIAGNOSIS — Z1231 Encounter for screening mammogram for malignant neoplasm of breast: Secondary | ICD-10-CM

## 2022-07-11 ENCOUNTER — Encounter: Payer: Self-pay | Admitting: Family Medicine

## 2022-08-22 ENCOUNTER — Ambulatory Visit
Admission: RE | Admit: 2022-08-22 | Discharge: 2022-08-22 | Disposition: A | Discharge status: Home / Self Care | Payer: 59 | Source: Ambulatory Visit | Attending: Family Medicine | Admitting: Family Medicine

## 2022-08-22 DIAGNOSIS — Z1231 Encounter for screening mammogram for malignant neoplasm of breast: Secondary | ICD-10-CM

## 2022-09-20 ENCOUNTER — Ambulatory Visit: Payer: 59 | Admitting: Family Medicine

## 2022-09-28 ENCOUNTER — Encounter: Payer: Self-pay | Admitting: Family Medicine

## 2022-09-29 NOTE — Telephone Encounter (Signed)
Attempted to call, no answer. LVM for pt's mom to call back.

## 2023-01-09 LAB — HM COLONOSCOPY

## 2023-05-01 ENCOUNTER — Encounter: Payer: Managed Care, Other (non HMO) | Admitting: Family Medicine
